# Patient Record
Sex: Female | Born: 1970 | Race: Black or African American | Hispanic: No | Marital: Single | Smoking: Current every day smoker
Health system: Southern US, Community
[De-identification: ages and names within clinical notes are randomized; demographics above are authoritative.]

## PROBLEM LIST (undated history)

## (undated) DIAGNOSIS — IMO0002 Reserved for concepts with insufficient information to code with codable children: Secondary | ICD-10-CM

## (undated) DIAGNOSIS — D259 Leiomyoma of uterus, unspecified: Secondary | ICD-10-CM

## (undated) DIAGNOSIS — G8929 Other chronic pain: Secondary | ICD-10-CM

## (undated) DIAGNOSIS — K2980 Duodenitis without bleeding: Secondary | ICD-10-CM

## (undated) DIAGNOSIS — R109 Unspecified abdominal pain: Secondary | ICD-10-CM

## (undated) DIAGNOSIS — K760 Fatty (change of) liver, not elsewhere classified: Secondary | ICD-10-CM

## (undated) DIAGNOSIS — M549 Dorsalgia, unspecified: Secondary | ICD-10-CM

## (undated) DIAGNOSIS — E785 Hyperlipidemia, unspecified: Secondary | ICD-10-CM

## (undated) DIAGNOSIS — F32A Depression, unspecified: Secondary | ICD-10-CM

## (undated) DIAGNOSIS — F419 Anxiety disorder, unspecified: Secondary | ICD-10-CM

## (undated) DIAGNOSIS — F1210 Cannabis abuse, uncomplicated: Secondary | ICD-10-CM

## (undated) DIAGNOSIS — F329 Major depressive disorder, single episode, unspecified: Secondary | ICD-10-CM

## (undated) DIAGNOSIS — C8590 Non-Hodgkin lymphoma, unspecified, unspecified site: Secondary | ICD-10-CM

## (undated) HISTORY — DX: Non-Hodgkin lymphoma, unspecified, unspecified site: C85.90

## (undated) HISTORY — DX: Other chronic pain: G89.29

## (undated) HISTORY — DX: Fatty (change of) liver, not elsewhere classified: K76.0

## (undated) HISTORY — DX: Dorsalgia, unspecified: M54.9

## (undated) HISTORY — DX: Unspecified abdominal pain: R10.9

## (undated) HISTORY — DX: Reserved for concepts with insufficient information to code with codable children: IMO0002

## (undated) HISTORY — DX: Duodenitis without bleeding: K29.80

## (undated) HISTORY — DX: Cannabis abuse, uncomplicated: F12.10

## (undated) HISTORY — DX: Leiomyoma of uterus, unspecified: D25.9

## (undated) HISTORY — DX: Hyperlipidemia, unspecified: E78.5

---

## 2005-11-25 ENCOUNTER — Emergency Department (HOSPITAL_COMMUNITY): Admission: EM | Admit: 2005-11-25 | Discharge: 2005-11-25 | Payer: Self-pay | Admitting: Emergency Medicine

## 2006-09-06 ENCOUNTER — Ambulatory Visit: Payer: Self-pay

## 2006-11-28 ENCOUNTER — Emergency Department (HOSPITAL_COMMUNITY): Admission: EM | Admit: 2006-11-28 | Discharge: 2006-11-28 | Payer: Self-pay | Admitting: Emergency Medicine

## 2006-12-06 HISTORY — PX: ABDOMINAL HYSTERECTOMY: SHX81

## 2007-10-18 ENCOUNTER — Encounter: Payer: Self-pay | Admitting: Obstetrics & Gynecology

## 2007-10-18 ENCOUNTER — Inpatient Hospital Stay (HOSPITAL_COMMUNITY): Admission: RE | Admit: 2007-10-18 | Discharge: 2007-10-20 | Payer: Self-pay | Admitting: Obstetrics & Gynecology

## 2008-01-16 ENCOUNTER — Ambulatory Visit (HOSPITAL_COMMUNITY): Admission: RE | Admit: 2008-01-16 | Discharge: 2008-01-16 | Payer: Self-pay | Admitting: Family Medicine

## 2008-01-29 ENCOUNTER — Emergency Department (HOSPITAL_COMMUNITY): Admission: EM | Admit: 2008-01-29 | Discharge: 2008-01-29 | Payer: Self-pay | Admitting: Emergency Medicine

## 2008-02-19 ENCOUNTER — Ambulatory Visit (HOSPITAL_COMMUNITY): Admission: RE | Admit: 2008-02-19 | Discharge: 2008-02-19 | Payer: Self-pay | Admitting: Obstetrics & Gynecology

## 2008-03-05 ENCOUNTER — Encounter (HOSPITAL_COMMUNITY)
Admission: RE | Admit: 2008-03-05 | Discharge: 2008-04-04 | Payer: Self-pay | Admitting: Rehabilitative and Restorative Service Providers"

## 2008-04-04 ENCOUNTER — Ambulatory Visit (HOSPITAL_COMMUNITY): Admission: RE | Admit: 2008-04-04 | Discharge: 2008-04-04 | Payer: Self-pay | Admitting: Family Medicine

## 2008-04-05 HISTORY — PX: ESOPHAGOGASTRODUODENOSCOPY: SHX1529

## 2008-04-14 ENCOUNTER — Emergency Department (HOSPITAL_COMMUNITY): Admission: EM | Admit: 2008-04-14 | Discharge: 2008-04-14 | Payer: Self-pay | Admitting: Emergency Medicine

## 2008-04-26 ENCOUNTER — Ambulatory Visit: Payer: Self-pay | Admitting: Internal Medicine

## 2008-05-01 ENCOUNTER — Ambulatory Visit: Payer: Self-pay | Admitting: Internal Medicine

## 2008-05-01 ENCOUNTER — Ambulatory Visit (HOSPITAL_COMMUNITY): Admission: RE | Admit: 2008-05-01 | Discharge: 2008-05-01 | Payer: Self-pay | Admitting: Internal Medicine

## 2008-05-01 ENCOUNTER — Encounter: Payer: Self-pay | Admitting: Internal Medicine

## 2008-05-06 HISTORY — PX: EUS: SHX5427

## 2008-05-07 DIAGNOSIS — IMO0002 Reserved for concepts with insufficient information to code with codable children: Secondary | ICD-10-CM | POA: Insufficient documentation

## 2008-05-10 ENCOUNTER — Encounter: Payer: Self-pay | Admitting: Gastroenterology

## 2008-05-14 ENCOUNTER — Encounter: Payer: Self-pay | Admitting: Gastroenterology

## 2008-05-14 DIAGNOSIS — K5710 Diverticulosis of small intestine without perforation or abscess without bleeding: Secondary | ICD-10-CM | POA: Insufficient documentation

## 2008-05-23 ENCOUNTER — Encounter: Payer: Self-pay | Admitting: Gastroenterology

## 2008-05-23 ENCOUNTER — Ambulatory Visit (HOSPITAL_COMMUNITY): Admission: RE | Admit: 2008-05-23 | Discharge: 2008-05-23 | Payer: Self-pay | Admitting: Gastroenterology

## 2008-05-27 ENCOUNTER — Ambulatory Visit: Payer: Self-pay | Admitting: Gastroenterology

## 2008-06-03 ENCOUNTER — Ambulatory Visit: Payer: Self-pay | Admitting: Gastroenterology

## 2008-06-10 ENCOUNTER — Encounter (HOSPITAL_COMMUNITY): Admission: RE | Admit: 2008-06-10 | Discharge: 2008-07-10 | Payer: Self-pay | Admitting: Gastroenterology

## 2008-06-25 ENCOUNTER — Ambulatory Visit: Payer: Self-pay | Admitting: Internal Medicine

## 2008-06-27 ENCOUNTER — Ambulatory Visit (HOSPITAL_COMMUNITY): Admission: RE | Admit: 2008-06-27 | Discharge: 2008-06-27 | Payer: Self-pay | Admitting: Internal Medicine

## 2008-09-05 HISTORY — PX: ESOPHAGOGASTRODUODENOSCOPY: SHX1529

## 2008-09-12 ENCOUNTER — Emergency Department (HOSPITAL_COMMUNITY): Admission: EM | Admit: 2008-09-12 | Discharge: 2008-09-12 | Payer: Self-pay | Admitting: Emergency Medicine

## 2008-09-17 ENCOUNTER — Ambulatory Visit: Payer: Self-pay | Admitting: Internal Medicine

## 2008-09-30 ENCOUNTER — Encounter (HOSPITAL_COMMUNITY): Admission: RE | Admit: 2008-09-30 | Discharge: 2008-10-30 | Payer: Self-pay | Admitting: Internal Medicine

## 2008-12-19 ENCOUNTER — Emergency Department: Payer: Self-pay | Admitting: Emergency Medicine

## 2009-02-19 ENCOUNTER — Emergency Department (HOSPITAL_COMMUNITY): Admission: EM | Admit: 2009-02-19 | Discharge: 2009-02-19 | Payer: Self-pay | Admitting: Emergency Medicine

## 2009-05-23 ENCOUNTER — Emergency Department (HOSPITAL_COMMUNITY): Admission: EM | Admit: 2009-05-23 | Discharge: 2009-05-23 | Payer: Self-pay | Admitting: Emergency Medicine

## 2009-06-05 ENCOUNTER — Encounter (INDEPENDENT_AMBULATORY_CARE_PROVIDER_SITE_OTHER): Payer: Self-pay | Admitting: Diagnostic Radiology

## 2009-06-05 ENCOUNTER — Ambulatory Visit (HOSPITAL_COMMUNITY): Admission: RE | Admit: 2009-06-05 | Discharge: 2009-06-05 | Payer: Self-pay | Admitting: Family Medicine

## 2009-11-28 IMAGING — CT CT ABDOMEN W/ CM
2 of 3 series · 14 of 32 positions shown, 20 images · IV contrast (Omnipaque 300)
Comparison: None

CLINICAL DATA: Right upper quadrant pain enlarged liver

CT ABDOMEN WITH CONTRAST
TECHNIQUE: Multidetector CT imaging of the abdomen was performed
following the standard protocol during bolus administration of
intravenous contrast.
Contrast: 100 ml Dmnipaque-PPP

[Series 2: abd_pel 5.0 b40f · axial · 0.69mm/px · z∈[-310,-70]mm · 12 of 58 slices shown, 18 images]
[im 5/58  soft-tissue]
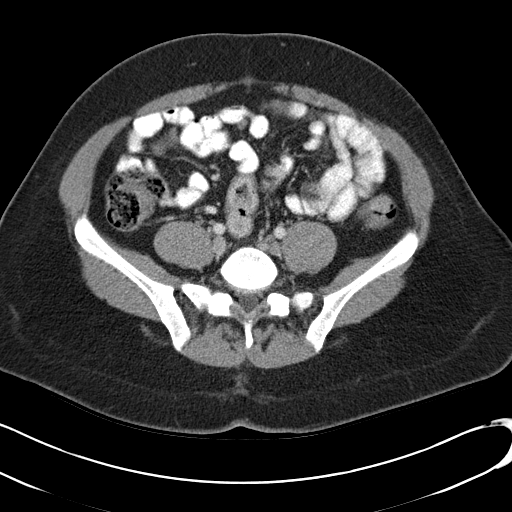
[im 5/58  bone]
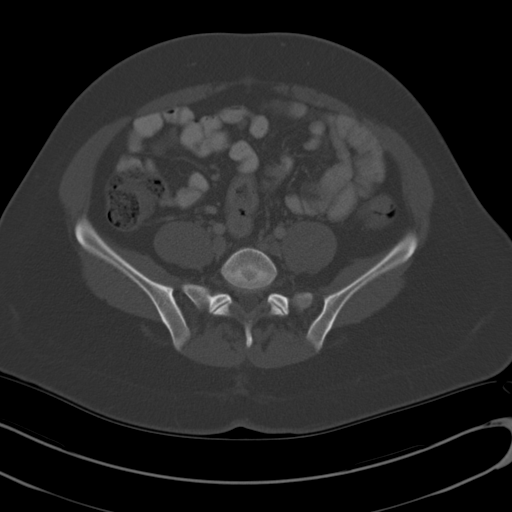
[im 9/58  soft-tissue]
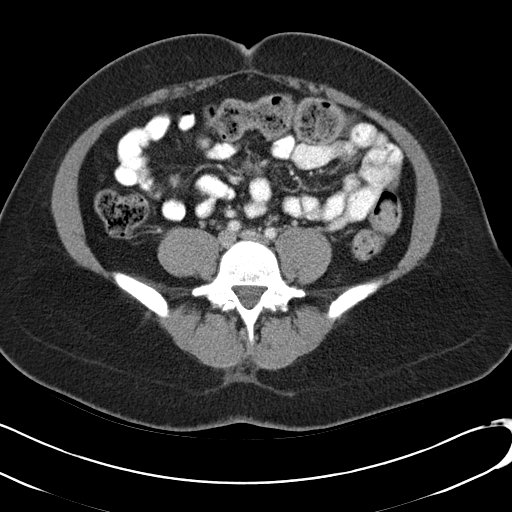
[im 14/58  soft-tissue]
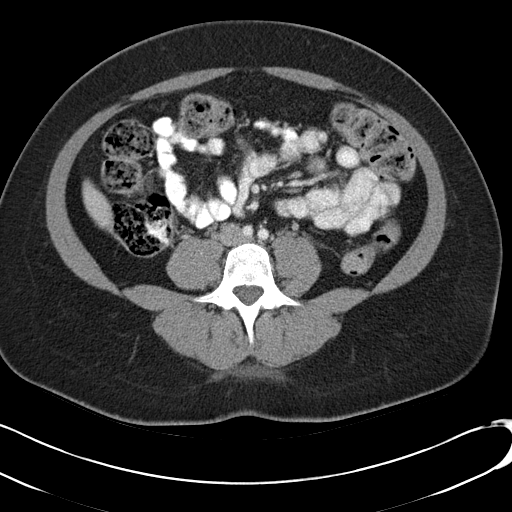
[im 18/58  soft-tissue]
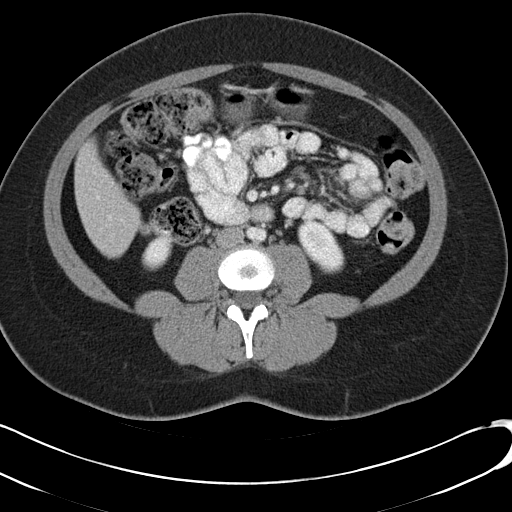
[im 22/58  soft-tissue]
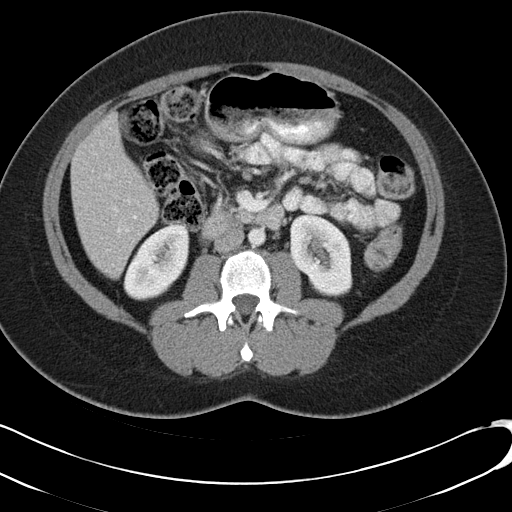
[im 27/58  soft-tissue]
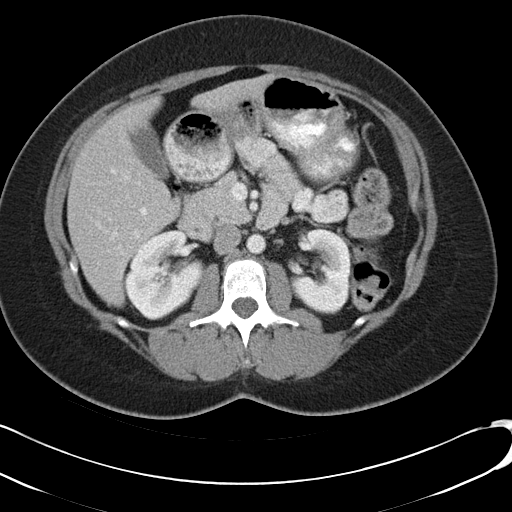
[im 31/58  soft-tissue]
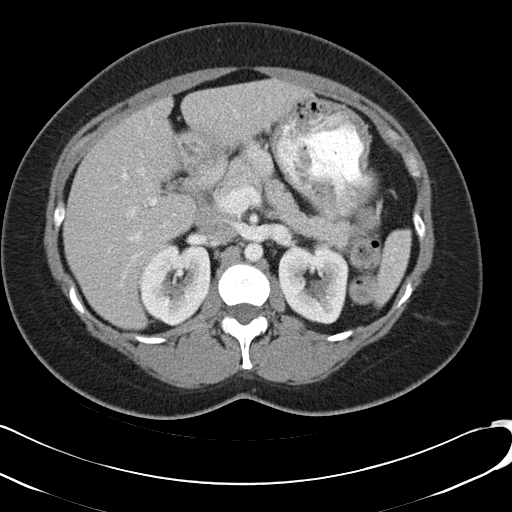
[im 36/58  soft-tissue]
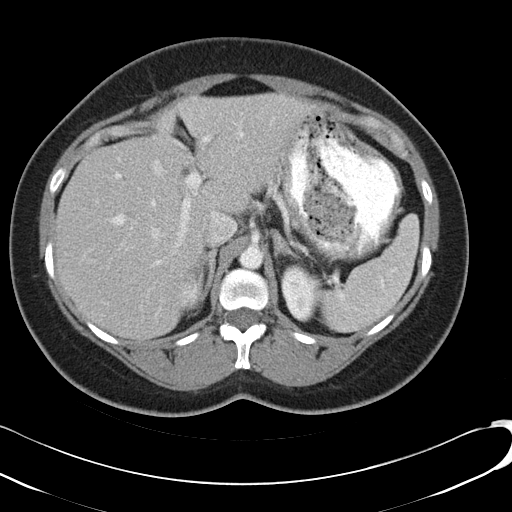
[im 40/58  soft-tissue]
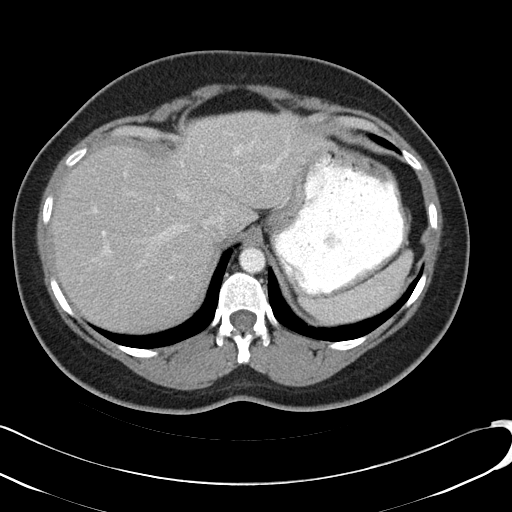
[im 40/58  lung]
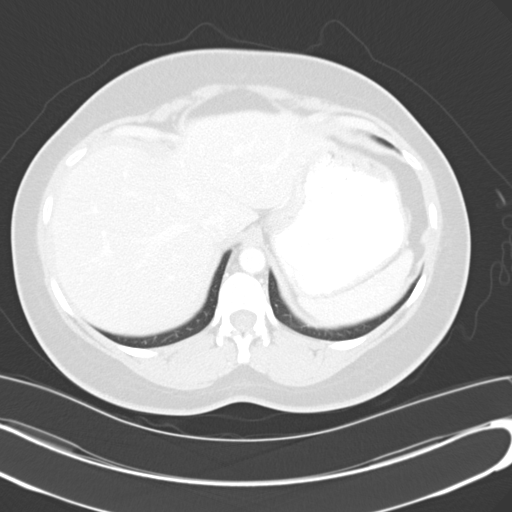
[im 40/58  bone]
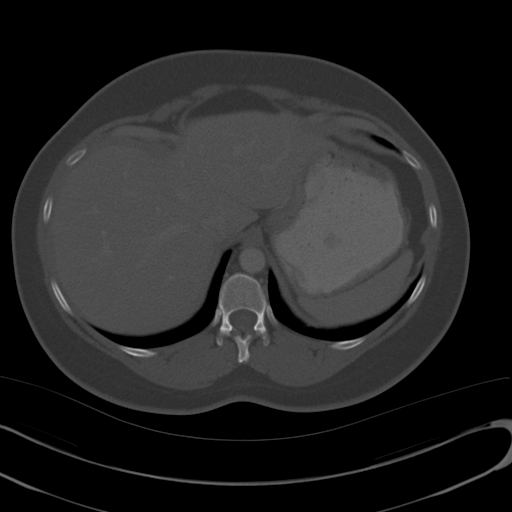
[im 44/58  soft-tissue]
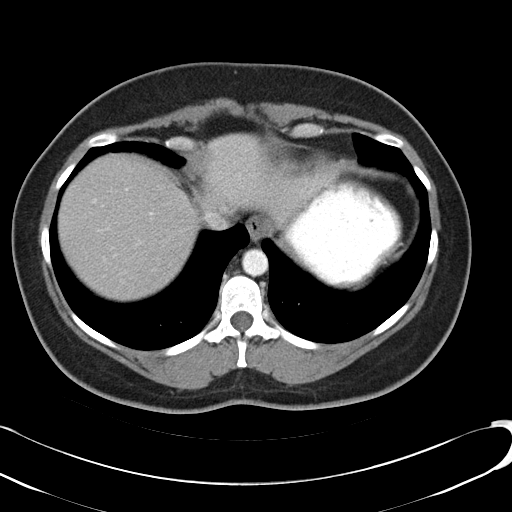
[im 44/58  lung]
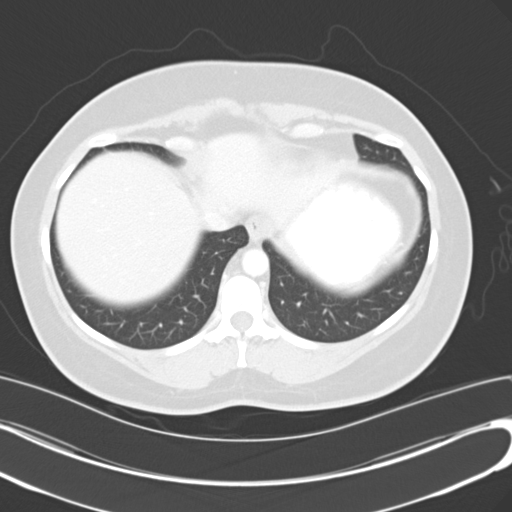
[im 49/58  soft-tissue]
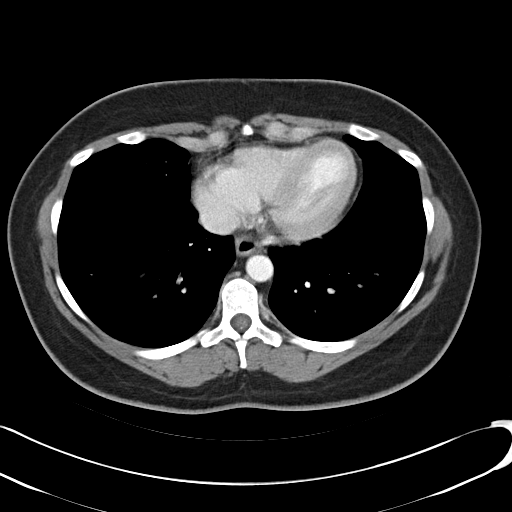
[im 49/58  lung]
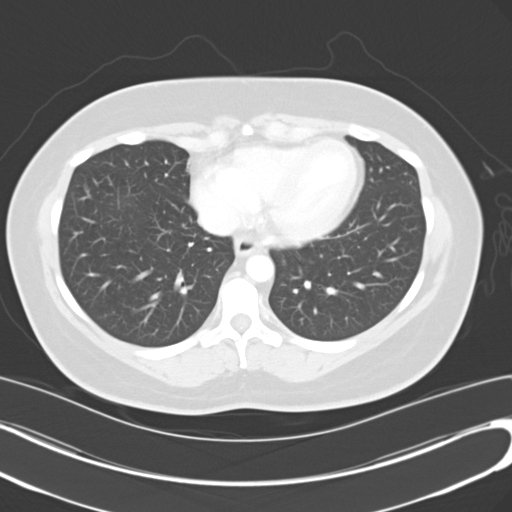
[im 53/58  soft-tissue]
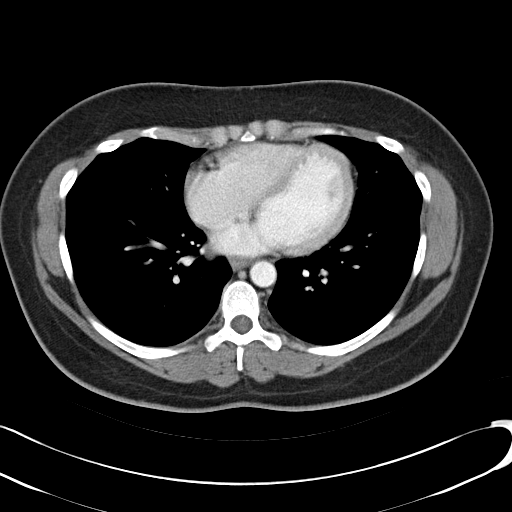
[im 53/58  lung]
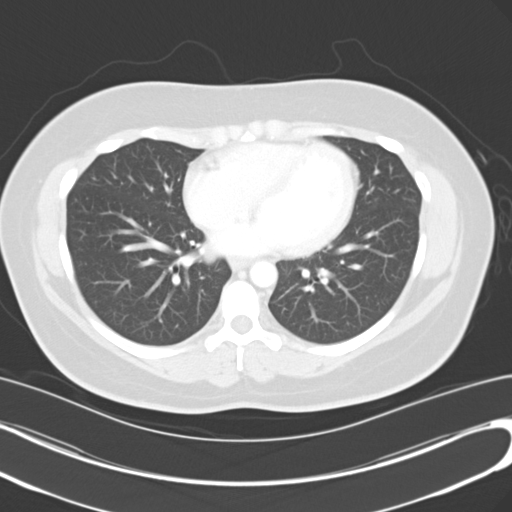

[Series 3: lung 5.0 b70f · axial · 0.69mm/px · z∈[-130,-110]mm · 2 of 22 slices shown]
[im 5/22  bone]
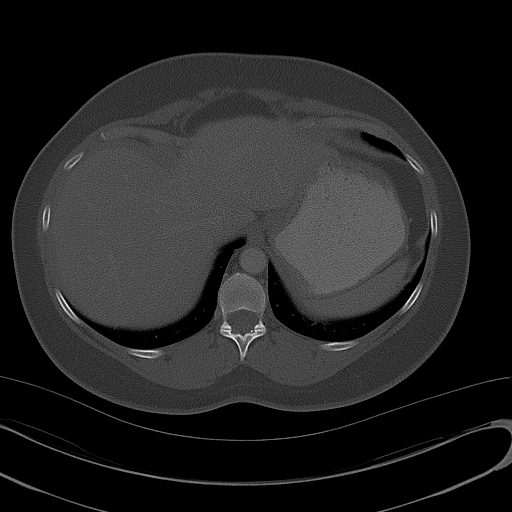
[im 9/22  bone]
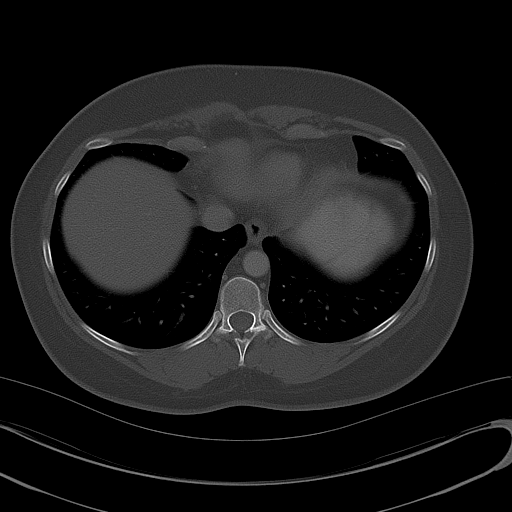

[14 of 32 positions shown; findings below may reference images not displayed]

FINDINGS: Lung bases are clear.  No pleural or pericardial fluid.
The liver appears within normal limits without evidence of focal
lesion or biliary ductal dilatation.  Gallbladder appears
unremarkable.  Size of the liver appears normal.  The spleen,
pancreas, adrenal glands and kidneys are normal.  The aorta and IVC
are normal.  No retroperitoneal mass or adenopathy.  No free
intraperitoneal fluid or air.  No bowel pathology seen in the
abdominal portion of the scan.
IMPRESSION: Examination within normal limits.

## 2009-12-06 DIAGNOSIS — C8590 Non-Hodgkin lymphoma, unspecified, unspecified site: Secondary | ICD-10-CM

## 2009-12-06 HISTORY — DX: Non-Hodgkin lymphoma, unspecified, unspecified site: C85.90

## 2009-12-06 HISTORY — PX: LYMPHADENECTOMY: SHX15

## 2010-07-23 ENCOUNTER — Encounter (INDEPENDENT_AMBULATORY_CARE_PROVIDER_SITE_OTHER): Payer: Self-pay

## 2010-07-28 ENCOUNTER — Ambulatory Visit: Payer: Self-pay | Admitting: Internal Medicine

## 2010-07-28 ENCOUNTER — Encounter: Payer: Self-pay | Admitting: Gastroenterology

## 2010-07-28 DIAGNOSIS — R1013 Epigastric pain: Secondary | ICD-10-CM

## 2010-07-28 DIAGNOSIS — D721 Eosinophilia: Secondary | ICD-10-CM

## 2010-07-28 DIAGNOSIS — C8589 Other specified types of non-Hodgkin lymphoma, extranodal and solid organ sites: Secondary | ICD-10-CM

## 2010-07-29 DIAGNOSIS — D696 Thrombocytopenia, unspecified: Secondary | ICD-10-CM | POA: Insufficient documentation

## 2010-07-30 ENCOUNTER — Ambulatory Visit: Payer: Self-pay | Admitting: Internal Medicine

## 2010-07-30 ENCOUNTER — Ambulatory Visit (HOSPITAL_COMMUNITY): Admission: RE | Admit: 2010-07-30 | Discharge: 2010-07-30 | Payer: Self-pay | Admitting: Internal Medicine

## 2010-07-31 ENCOUNTER — Encounter: Payer: Self-pay | Admitting: Gastroenterology

## 2010-07-31 HISTORY — PX: COLONOSCOPY: SHX174

## 2010-07-31 LAB — CONVERTED CEMR LAB
Basophils Relative: 0 % (ref 0–1)
Eosinophils Absolute: 0.2 10*3/uL (ref 0.0–0.7)
MCHC: 33.2 g/dL (ref 30.0–36.0)
MCV: 93.9 fL (ref 78.0–100.0)
Monocytes Relative: 8 % (ref 3–12)
Neutrophils Relative %: 54 % (ref 43–77)
Platelets: 135 10*3/uL — ABNORMAL LOW (ref 150–400)
RBC: 4.56 M/uL (ref 3.87–5.11)

## 2010-08-04 ENCOUNTER — Encounter: Payer: Self-pay | Admitting: Internal Medicine

## 2010-08-06 ENCOUNTER — Encounter (INDEPENDENT_AMBULATORY_CARE_PROVIDER_SITE_OTHER): Payer: Self-pay

## 2010-08-06 ENCOUNTER — Telehealth (INDEPENDENT_AMBULATORY_CARE_PROVIDER_SITE_OTHER): Payer: Self-pay

## 2010-08-18 ENCOUNTER — Encounter: Payer: Self-pay | Admitting: Urgent Care

## 2010-08-18 ENCOUNTER — Ambulatory Visit: Payer: Self-pay | Admitting: Internal Medicine

## 2010-08-18 DIAGNOSIS — R112 Nausea with vomiting, unspecified: Secondary | ICD-10-CM

## 2010-08-18 DIAGNOSIS — R109 Unspecified abdominal pain: Secondary | ICD-10-CM | POA: Insufficient documentation

## 2010-08-18 DIAGNOSIS — R110 Nausea: Secondary | ICD-10-CM | POA: Insufficient documentation

## 2010-08-19 ENCOUNTER — Ambulatory Visit: Payer: Self-pay | Admitting: Internal Medicine

## 2010-08-19 ENCOUNTER — Ambulatory Visit (HOSPITAL_COMMUNITY): Admission: RE | Admit: 2010-08-19 | Discharge: 2010-08-19 | Payer: Self-pay | Admitting: Internal Medicine

## 2010-08-19 HISTORY — PX: ESOPHAGOGASTRODUODENOSCOPY: SHX1529

## 2010-08-21 LAB — CONVERTED CEMR LAB
Basophils Absolute: 0 10*3/uL (ref 0.0–0.1)
Basophils Relative: 0 % (ref 0–1)
MCHC: 33 g/dL (ref 30.0–36.0)
Monocytes Relative: 6 % (ref 3–12)
Neutro Abs: 3.1 10*3/uL (ref 1.7–7.7)
Neutrophils Relative %: 65 % (ref 43–77)
RBC: 4.65 M/uL (ref 3.87–5.11)

## 2010-08-24 ENCOUNTER — Encounter: Payer: Self-pay | Admitting: Internal Medicine

## 2010-08-27 ENCOUNTER — Encounter: Payer: Self-pay | Admitting: Internal Medicine

## 2010-09-08 ENCOUNTER — Telehealth (INDEPENDENT_AMBULATORY_CARE_PROVIDER_SITE_OTHER): Payer: Self-pay

## 2010-09-15 ENCOUNTER — Ambulatory Visit (HOSPITAL_COMMUNITY): Admission: RE | Admit: 2010-09-15 | Discharge: 2010-09-15 | Payer: Self-pay | Admitting: Family Medicine

## 2010-09-28 ENCOUNTER — Encounter (INDEPENDENT_AMBULATORY_CARE_PROVIDER_SITE_OTHER): Payer: Self-pay | Admitting: *Deleted

## 2010-09-29 ENCOUNTER — Ambulatory Visit: Payer: Self-pay | Admitting: Internal Medicine

## 2010-09-30 ENCOUNTER — Encounter: Payer: Self-pay | Admitting: Internal Medicine

## 2010-10-15 ENCOUNTER — Encounter: Payer: Self-pay | Admitting: Internal Medicine

## 2010-10-28 ENCOUNTER — Encounter: Payer: Self-pay | Admitting: Internal Medicine

## 2010-11-05 ENCOUNTER — Ambulatory Visit: Payer: Self-pay | Admitting: Internal Medicine

## 2010-11-09 DIAGNOSIS — K5900 Constipation, unspecified: Secondary | ICD-10-CM | POA: Insufficient documentation

## 2010-11-17 ENCOUNTER — Telehealth (INDEPENDENT_AMBULATORY_CARE_PROVIDER_SITE_OTHER): Payer: Self-pay | Admitting: *Deleted

## 2010-11-18 ENCOUNTER — Telehealth (INDEPENDENT_AMBULATORY_CARE_PROVIDER_SITE_OTHER): Payer: Self-pay

## 2010-12-01 ENCOUNTER — Encounter: Payer: Self-pay | Admitting: Internal Medicine

## 2010-12-06 HISTORY — PX: BREAST EXCISIONAL BIOPSY: SUR124

## 2010-12-10 ENCOUNTER — Ambulatory Visit (HOSPITAL_COMMUNITY)
Admission: RE | Admit: 2010-12-10 | Discharge: 2010-12-10 | Payer: Self-pay | Source: Home / Self Care | Attending: Neurology | Admitting: Neurology

## 2010-12-14 ENCOUNTER — Encounter: Payer: Self-pay | Admitting: Internal Medicine

## 2010-12-27 ENCOUNTER — Encounter: Payer: Self-pay | Admitting: Family Medicine

## 2011-01-06 NOTE — Letter (Signed)
Summary: Patient Notice, Endo Biopsy Results  [facility]  [address]   [place], [place] [address]   Phone: [phone]  Fax: [phone]       August 24, 2010   Donna Ramirez [address] [place], [place]  [address] 05-Aug-1971    Dear [name],  I am pleased to inform you that the biopsies taken during your recent endoscopic examination did not show any evidence of cancer upon pathologic examination.  There was mild inflammation.  Please call us if you are having persistent problems or have questions about your condition that have not been fully answered at this time.  Sincerely,    R. Roetta Sessions MD, FACP [facility] Ph: [phone]   Fax: [phone]   Appended Document: Patient Notice, Endo Biopsy Results Letter mailed to pt.   Appended Document: Patient Notice, Endo Biopsy Results reminder in computer

## 2011-01-06 NOTE — Medication Information (Signed)
Summary: vicodin rx  vicodin rx   Imported By: Hendricks Limes LPN 60/45/4098 11:91:47  _____________________________________________________________________  External Attachment:    Type:   Image     Comment:   External Document

## 2011-01-06 NOTE — Assessment & Plan Note (Signed)
Summary: CONSULT FOR TCS/ABD PAIN/SS   Visit Type:  Consult Referring Provider:  Hoover Brunette Primary Care Provider:  [facility]  Chief Complaint:  abd pain.  History of Present Illness: Donna Ramirez is a pleasant 40 y/o AA female, patient of Dr. Lahoma Rocker, who presents for further evaluation of chronic abd pain. Last seen in 10/09. She has h/o chronic abd wall pain. She was evaluated here locally as well as [facility]. She was set up for PT for chronic abd wall pain. She went to at least one visit, but had multiple no shows thereafter. Patient also had EGD by Dr. Jena Gauss in 2009 which showed mild RE, 1.5cm peduncualted mass of distal D2. Bx benign. EUS by Dr. Christella Hartigan showed focal mild inflammation assoicated with slight atypia of ampullary mucosa, benign small duodenal nodule. EGD by Dr. Margaretha Glassing 10/09 showed no duodenal mass and was normal exam.  Since her last visit, she was diagnosed with lymphoma (8/10). She is followed at [facility]. Treatments with chemo/xrt. Finished chemo 12/10. Finished xrt spring 2011.   She continues to have chronic abd pain. Nothing makes better/worse. If walks, hurts. Epigastrium. Occasionally radiates into back. Unrelated to BMs. Sometimes if upset/nervous makes worse.  No heartburn/indigestion. No dysphagia. BM regular. No melena, brbpr. No weight loss.   Labs 06/01/10: Tbili 0.5, AP 90, AST 15, ALT 13, lipase 19. OLD LABS 7/10: Plt 117,000, EOS 14%H, HIV neg, ANA neg,   Current Medications (verified): 1)  Estratest .... Once Daily  Allergies: No Known Drug Allergies  Past History:  Past Medical History: Current Problems:  DEGENERATIVE DISC DISEASE (ICD-722.6) chronic abd pain, seen at [facility], 2009-->abd wall pain EGD 5/09, Dr. Dellie Catholic RE, noncritical Schatzki's ring s/p dilatation, 1.5cm pedunculated mass distal D2 (bx negative) EUS, 6/09, Dr. Lauralyn Primes pedunculated lesion seen. 6-81mm nodule seen and had evidence of recent  mucosal bx (2-3cm distal to major papilla). Bx benign. 1cm somewhat adenomatous mucos at major papilla bx(focal mild inflammation associated with sligh atypia). EGD at [facility] 10/09 was normal  degenerative disc disease hearing loss in childhood,cause unknown ascus on pap 03 with multi normal paps since non-hodgkins lymphoma marijunana abuse back pain chronic nausea mild fatty infiltration of the liver uterine fibroids, s/p hysterectomy  Past Surgical History: BSO 11/08 Hysterectomy 11/08 Lymph node bx, lymphoma  Family History: Family History of Breast Cancer: Mother age 2 Family History of Colon Cancer: questionable PGM, questionable great Uncle/Aunt Family History of Prostate Cancer: Father Family History of Diabetes: MGM, Aunt Aunt, ischemic bowel Father, colon polyps  Social History: Disabled. 1/2ppd, trying to cut-back. Rare alcohol. Some marijuana use, once per week. No IV/intranasal drugs. No children. In relationship, with girlfriend.  Review of Systems General:  Denies fever, chills, sweats, anorexia, fatigue, weakness, and weight loss; weight down 20 pounds since 2009. Patient denies recent weight loss.. Eyes:  Denies vision loss. ENT:  Denies nasal congestion, sore throat, hoarseness, and difficulty swallowing. CV:  Denies chest pains, angina, palpitations, dyspnea on exertion, and peripheral edema. Resp:  Denies dyspnea at rest, dyspnea with exercise, cough, sputum, and wheezing. GI:  See HPI. GU:  Denies urinary burning and blood in urine. MS:  Complains of low back pain; denies joint pain / LOM. Derm:  Denies rash and itching. Neuro:  Denies weakness, frequent headaches, memory loss, and confusion. Psych:  Denies depression and anxiety. Endo:  Denies unusual weight change. Heme:  Denies bruising and bleeding. Allergy:  Denies hives and rash.  Vital Signs:  Patient profile:  40 year old female Height:      65 inches Weight:      157 pounds BMI:      26.22 Temp:     97.9 degrees F oral Pulse rate:   68 / minute BP sitting:   112 / 78  (left arm) Cuff size:   regular  Vitals Entered By: Hendricks Limes LPN (July 28, 2010 8:40 AM)  Physical Exam  General:  Well developed, well nourished, no acute distress. Head:  Normocephalic and atraumatic. Eyes:  sclera nonicteric Mouth:  Oropharyngeal mucosa moist, pink.  No lesions, erythema or exudate.    Neck:  Supple; no masses or thyromegaly. Lungs:  Clear throughout to auscultation. Heart:  Regular rate and rhythm; no murmurs, rubs,  or bruits. Abdomen:  Soft. Positive BS. Mild to moderate epigastric tenderness. No rebound or guarding. No HSM or masses. No abd bruit or hernia.  Rectal:  deferred until time of colonoscopy.   Extremities:  No clubbing, cyanosis, edema or deformities noted. Neurologic:  Alert and  oriented x4;  grossly normal neurologically. Skin:  Intact without significant lesions or rashes. Cervical Nodes:  No significant cervical adenopathy. Psych:  Alert and cooperative. Normal mood and affect.  Impression & Recommendations:  Problem # 1:  ABDOMINAL PAIN, EPIGASTRIC (ICD-789.06) Chronic abd pain with h/o chronic abd wall pain. She did not follow-through with PT in 2009. Since has had diagnosis of lymphoma, has completed treatment at this point. Need to obtain records from [facility] (Dr. Hoyle Sauer). Will see if any recent imaging done. Interestingly, older CBC showed elevated eosinophil count. Recheck CBC. If remains elevated, she will need EGD with bx. Will discuss with Dr. Jena Gauss as well.  Orders: T-CBC w/Diff ([id number]) Consultation Level IV (72536)  Problem # 2:  NEOPLASM, MALIGNANT, COLON, FAMILY HX (ICD-V16.0)  FH of CRC grandparent, father has had colon polyps, personal h/o lymphoma. Patient with abd pain. PCP requesting TCS. Colonoscopy to be performed in near future.  Risks, alternatives, and benefits including but not limited to the risk of reaction to  medication, bleeding, infection, and perforation were addressed.  Patient voiced understanding and provided verbal consent.   Orders: Consultation Level IV (64403) I would like to thank Dr. Lahoma Rocker for allowing [place] to take part in the care of this nice patient.  Appended Document: CONSULT FOR TCS/ABD PAIN/SS Discussed with Dr. Jena Gauss. Given h/o epigstric pain, lymphoma, duodenal lesion,etc would go ahead and do EGD at time of TCS.  Please let pt know. Please note RX for pain as requested by patient yesterday but she left office first. Please call in to pharmacy of choice.  Please add EGD at time of her TCS.     Prescriptions: HYDROCODONE-ACETAMINOPHEN 5-500 MG TABS (HYDROCODONE-ACETAMINOPHEN) one by mouth every 4-6 hours as needed pain  #20 x 0   Entered and Authorized by:   Leanna Battles. Dixon Boos   Signed by:   Leanna Battles Lewis PA-C on 07/29/2010   Method used:   Telephoned to ...         RxID:   [id number]     Appended Document: CONSULT FOR TCS/ABD PAIN/SS Called, mail box full.  Appended Document: CONSULT FOR TCS/ABD PAIN/SS pt had tcs on 07/30/10  Appended Document: CONSULT FOR TCS/ABD PAIN/SS Discussed with Dr. Jena Gauss. Hold on EGD for now. Patient needs OV for f/u in 4 weeks. Will reevaluate then, if still with abd pain, consider EGD at that point.

## 2011-01-06 NOTE — Letter (Signed)
Summary: CLINIC NOTE FROM [facility]  CLINIC NOTE FROM [facility]   Imported By: Rexene Alberts 10/28/2010 16:07:16  _____________________________________________________________________  External Attachment:    Type:   Image     Comment:   External Document

## 2011-01-06 NOTE — Letter (Signed)
Summary: TCS ORDER  TCS ORDER   Imported By: Rexene Alberts 07/28/2010 10:02:30  _____________________________________________________________________  External Attachment:    Type:   Image     Comment:   External Document

## 2011-01-06 NOTE — Progress Notes (Signed)
Summary: pt requesting pain meds  Phone Note Call from Patient Call back at Home Phone [phone] Call back at [phone]   Caller: Patient Summary of Call: pt called- left voicemail- she has appt on Oct. 25th with [facility]. wants to know if she can have some non-narcotic pain meds to last untill appt. Vicodin makes her sick on her stomach. pt requestin ultram or something similar called to [employer]. Initial call taken by: Hendricks Limes LPN,  September 08, 2010 9:31 AM     Appended Document: pt requesting pain meds    Prescriptions: LEVSIN 0.125 MG TABS (HYOSCYAMINE SULFATE) 1 by mouth ac/qhs as needed (up to QID) for abd pain  #90 x 1   Entered and Authorized by:   Joselyn Arrow FNP-BC   Signed by:   Joselyn Arrow FNP-BC on 09/08/2010   Method used:   Electronically to        [facility] (retail)       [address]       [place], [place]  [address]       Ph: [phone]       Fax: [phone]   RxID:   [id number]

## 2011-01-06 NOTE — Letter (Signed)
Summary: Scheduled Appointment  [facility]  [address]   [place], [place] [address]   Phone: [phone]  Fax: [phone]    September 28, 2010   Dear: Layla Barter            DOB: [birth year]    I have been instructed to schedule you an appointment in our office.  Your appointment is as follows:   Date:              September 29, 2010   Time:              11AM     Please be here 15 minutes early.   Provider:        Jaye Beagle   Please contact the office if you need to reschedule this appointment for a more convenient time.   Thank you,    Diana Eves       [facility] Ph: [phone]   Fax: [phone]

## 2011-01-06 NOTE — Assessment & Plan Note (Signed)
Summary: REEVAL TO SEE IF EGD IS NEEDED/LAW   Visit Type:  Follow-up Visit Primary Care Donna Ramirez:  [facility])  Chief Complaint:  ? EGD.  History of Present Illness: 40 y/o black female w/ chronic abd pain.  Pain used to be intermittant, but now all day , every day.  Upper abd pain, feels like cramps, 10/10, takes vicodin 5/500mg  prn once daily or BID usually.    c/o nausea, no vomiting,  Wt stable,  Appetite ok.  BM Q1-2 days.  Denies dysphagia, odynophagia, indigestion, or heartburn.  Denies fever, occ night sweats.  Dr Barbara Cower [facility]-oncologist.  Pt in remission Diffuse B-cell lymphoma.   Recent colonscopy by Dr Rourk->hyperplastic polyp.  H/o chronic abd wall pain. She was evaluated here locally as well as [facility]. Had EGD by Dr. Jena Gauss in 2009 which showed mild RE, 1.5cm peduncualted mass of distal D2. Bx benign. EUS by Dr. Christella Hartigan showed focal mild inflammation assoicated with slight atypia of ampullary mucosa, benign small duodenal nodule. EGD by Dr. Margaretha Glassing 10/09 showed no duodenal mass and was normal exam. She is here to set up EGD for further evaluation of her chronic abd pain, unchanged w/ PPI use.   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 Once Daily 2)  Hydrocodone-Acetaminophen 5-500 Mg Tabs (Hydrocodone-Acetaminophen) .... One By Mouth Every 4-6 Hours As Needed Pain  Allergies (verified): No Known Drug Allergies  Past History:  Family History: Last updated: 07/28/2010 Family History of Breast Cancer: Mother age 65 Family History of Colon Cancer: questionable PGM, questionable great  Uncle/Aunt Family History of Prostate Cancer: Father Family History of Diabetes: MGM, Aunt Aunt, ischemic bowel Father, colon polyps  Social History: Last updated: 07/28/2010 Disabled. 1/2ppd, trying to cut-back. Rare alcohol. Some marijuana use, once per week. No IV/intranasal drugs. No children. In relationship, with girlfriend.  Past Medical History: Current Problems: colonoscopy Dr Jena Gauss 8/26->hyperplastic polyp DEGENERATIVE DISC DISEASE (ICD-722.6) chronic abd pain, seen at [facility], 2009-->abd wall pain EGD 5/09, Dr. Dellie Catholic RE, noncritical Schatzki's ring s/p dilatation, 1.5cm pedunculated mass distal D2 (bx negative) EUS, 6/09, Dr. Lauralyn Primes pedunculated lesion seen. 6-22mm nodule seen and had evidence of recent mucosal bx (2-3cm distal to major papilla). Bx benign. 1cm somewhat adenomatous mucos at major papilla bx(focal mild inflammation associated with sligh atypia). EGD at [facility] 10/09 was normal  degenerative disc disease hearing loss in childhood,cause unknown ascus on pap 03 with multi normal paps since non-hodgkins lymphoma marijunana abuse back pain chronic nausea mild fatty infiltration of the liver uterine fibroids, s/p hysterectomy  Past Surgical History: Reviewed history from 07/28/2010 and no changes required. BSO 11/08 Hysterectomy 11/08 Lymph node bx, lymphoma  Review of Systems      See HPI General:  Denies fever, chills, sweats, anorexia, fatigue, weakness, malaise, weight loss, and sleep disorder. CV:  Denies chest pains, angina, palpitations, syncope, dyspnea on exertion, orthopnea, PND, peripheral edema, and claudication. Resp:  Denies dyspnea at rest, dyspnea with exercise, cough, sputum, wheezing, coughing up blood, and pleurisy. GI:  See HPI. GU:  Denies urinary burning, blood in urine, nocturnal urination, urinary frequency, and urinary incontinence. MS:  Denies joint pain / LOM,  joint swelling, joint stiffness, joint deformity, low  back pain, muscle weakness, muscle cramps, muscle atrophy, leg pain at night, leg pain with exertion, and shoulder pain / LOM hand / wrist pain (CTS). Derm:  Denies rash, itching, dry skin, hives, moles, warts, and unhealing ulcers. Psych:  Denies depression, anxiety, memory loss, suicidal ideation, hallucinations, paranoia, phobia, and confusion. Heme:  Denies bruising, bleeding, and enlarged lymph nodes.  Vital Signs:  Patient profile:   40 year old female Height:      65 inches Weight:      158 pounds BMI:     26.39 Temp:     97.7 degrees F oral Pulse rate:   88 / minute BP sitting:   100 / 72  (left arm) Cuff size:   regular  Vitals Entered By: Cloria Spring LPN (August 18, 2010 11:09 AM)  Physical Exam  General:  Well developed, well nourished, no acute distress. Head:  Normocephalic and atraumatic. Eyes:  sclera nonicteric Mouth:  Oropharyngeal mucosa moist, pink.  No lesions, erythema or exudate.    Neck:  Supple; no masses or thyromegaly. Lungs:  Clear throughout to auscultation. Heart:  Regular rate and rhythm; no murmurs, rubs,  or bruits. Abdomen:  normal bowel sounds, without guarding, without rebound, no hernia, no distesion, no tenderness, no masses, and no hepatomegally or splenomegaly.   Rectal:  deferred until time of colonoscopy.   Msk:  Symmetrical with no gross deformities. Normal posture. Extremities:  No clubbing, cyanosis, edema or deformities noted. Neurologic:  Alert and  oriented x4;  grossly normal neurologically. Skin:  Intact without significant lesions or rashes. Cervical Nodes:  No significant cervical adenopathy. Psych:  Alert and cooperative. Normal mood and affect.  Impression & Recommendations:  Problem # 1:  ABDOMINAL PAIN, EPIGASTRIC (ICD-789.06) Chronic abd pain with h/o chronic abd wall pain. Recent colonoscopy benign.,  ? etiology.  DIfferentials include PUD, dyspesia, functional abd pain, doubt occult malignancy.  Hx B-cell lymphoma,  has completed treatment at this point, in remission. Awaiting records from [facility] (Dr. Hoyle Sauer).    EGD to be performed by Dr. Jonathon Bellows in the near future.  I have discussed risks and benefits which include, but are not limited to, bleeding, infection, perforation, or medication reaction.  The patient agrees with this plan and consent will be obtained.  Problem # 2:  DUODENAL DIVERTICULUM (ICD-562.00)  Appended Document: REEVAL TO SEE IF EGD IS NEEDED/LAW Vicodin 5/500mg  1 by mouth q4-6 hrs as needed for severe pain, #20, no refills  Appended Document: Orders Update    Clinical Lists Changes  Problems: Added new problem of ABDOMINAL PAIN, CHRONIC (ICD-789.00) Added new problem of NAUSEA, CHRONIC (ICD-787.02) Orders: Added new Service order of Est. Patient Level III (41324) - Signed

## 2011-01-06 NOTE — Miscellaneous (Signed)
Summary: op notes  Clinical Lists Changes  NAME:  Donna Ramirez, Donna Ramirez                ACCOUNT NO.:  [id number]      MEDICAL RECORD NO.:  [id number]          PATIENT TYPE:  AMB      LOCATION:  DAY                           FACILITY:  [facility]      PHYSICIAN:  R. Roetta Sessions, M.D. DATE OF BIRTH:  1971/11/25      DATE OF PROCEDURE:  05/01/2008   DATE OF DISCHARGE:                                  OPERATIVE REPORT      Esophagogastroduodenoscopy with Elease Hashimoto dilation, followed by biopsy of   duodenum.      INDICATIONS FOR PROCEDURE:  A 40 year old lady with a 4-month history   of chronic nausea and dyspepsia.  A brief course of PPI therapy in the   way of OTC Prilosec has not really helped.  She has intermittent   esophageal dysphagia to solids.  She takes some Motrin on an   intermittent regular basis.  EGD is now being done.  Potential   esophageal dilation, biopsy, etc were reviewed with the patient.  Risks,   benefits, alternatives, and limitations have been reviewed and questions   were answered.  Please see the documentation in the medical record.      PROCEDURE NOTE:  O2 saturation, blood pressure, and pulse rate, and   respirations monitored throughout the entire procedure.      CONSCIOUS SEDATION:  Versed 4 mg IV and Demerol 100 g IV in divided   doses.  Cetacaine spray for topical pharyngeal anesthesia.      INSTRUMENT:  Pentax video chip system.      FINDINGS:  Examination of the tubular esophagus revealed noncritical   appearing Schatzki's ring and some tiny distal esophageal erosions   straddling the ring.  Otherwise, esophageal mucosa appeared   unremarkable.  EG junction easily traversed into his stomach.  Gastric   cavity was emptied, and insufflated well with air.  Thorough examination   of the gastric mucosa including retroflexed view of the proximal stomach   esophagogastric demonstrated only a small hiatal hernia.  Gastric mucosa   otherwise appeared entirely normal.   Pylorus was patent and easily   traversed.  Examination of the bulb, second, and third portion revealed   normal appearing ampulla and medial wall of second portion of the   duodenum.  There was a 1.5 cm pedunculated mass in the distal second   portion of the duodenum.  Please see photos.  Overlying mucosa had a   little different contour appearance, and the duodenum mucosa, there did   appear to be possibly submucosa component.  This does not appear to be   an out-and-out adenoma or neoplasm.      THERAPEUTIC/DIAGNOSTIC MANEUVERS PERFORMED:  Scope was withdrawn.  A 56-   French Maloney dilator was in full insertion with ease, look back   revealed.  No apparent complications related to passage of the dilator   subsequently.  Biopsy of the duodenum mass were taken.  I got two good   bites of this lesion with some  depth.  There was minimal bleeding.  The   patient tolerated the procedure well and was reactive in endoscopy.      IMPRESSION:   1. Distal esophageal erosions consistent with the mild reflux       esophagitis, noncritical appearing Schatzki's ring dilated as       described above, but otherwise, unremarkable esophagus, small       hiatal hernia, otherwise normal stomach and patent pylorus.  Normal       D1 and D2, and 1.5-cm pedunculated mass, distal D2 as described       above biopsy.      RECOMMENDATIONS:   1. Begin Aciphex 20 mg orally daily, literature on gastroesophageal       reflux disease providedto Ms. Burnett.   2. Follow up on biopsies.   3. The duodenum lesion may need further evaluation in the way of EUS       etc.               R. Roetta Sessions, M.D.   Electronically Signed            RMR/MEDQ  D:  05/01/2008  T:  05/01/2008  Job:  [id number]    SP-Surgical Pathology - STATUS: Final  .                                         Perform Date: [id number] 00:01  Ordered By: Jena Gauss MD , Gerrit Friends           Ordered Date: 27May09 14:08  Facility: [facility]                                Department: CPATH  Service Report Text  [facility]   [address] [place], [place] [address]   [phone]    REPORT OF SURGICAL PATHOLOGY    Case #: [id number]   Patient Name: Donna Ramirez, Donna Ramirez.   Office Chart Number: N/A    MRN: [id number]   Pathologist: Lyn Hollingshead. Delila Spence, MD   DOB/Age 10-05-1971 (Age: 42) Gender: F   Date Taken: 05/01/2008   Date Received: 05/01/2008    FINAL DIAGNOSIS    ***MICROSCOPIC EXAMINATION AND DIAGNOSIS***    DUODENUM, POLYPOID MASS, SECOND PART OF DUODENUM, BIOPSY:   - POLYPOID FRAGMENTS OF BENIGN COLONIC MUCOSA WITH   UNDERLYING PROMINENT   BRUNER' S TYPE GLANDS.   - NO ADENOMATOUS CHANGE OR MALIGNANCY IDENTIFIED.    cc   Date Reported: 05/02/2008 Alden Server A. Delila Spence, MD   *** Electronically Signed Out By EAA ***    Clinical information   Nausea and abdominal pain; esophageal ring (km)    specimen(s) obtained   Duodenum, biopsy, polypoid mass 2nd part of duodenum    Gross Description   Received in formalin are tan, soft tissue fragments that are   submitted in toto. Number: 2   Size: 0.3 cm (SHP:kv 05-02-08)    kv/   Additional Information  HL7 RESULT STATUS : F  External IF Update Timestamp : 2008-05-01:14:10:00.000000  CT Abdomen With Contrast - STATUS: Final  IMAGE  Perform Date: 30Apr09 16:45  Ordered By: DEFAULT MD , PROVIDER         Ordered Date: 30Apr09 16:11  Facility: [facility]                               Department: CT  Service Report Text  [facility] Accession Number: [id number]      Clinical Data: Right upper quadrant pain enlarged liver    CT ABDOMEN WITH CONTRAST    Technique:  Multidetector CT imaging of the abdomen was performed   following the standard protocol during bolus administration of   intravenous contrast.    Contrast: 100 ml Omnipaque-300    Comparison: None    Findings: Lung bases are clear.  No pleural or pericardial fluid.   The liver  appears within normal limits without evidence of focal   lesion or biliary ductal dilatation.  Gallbladder appears   unremarkable.  Size of the liver appears normal.  The spleen,   pancreas, adrenal glands and kidneys are normal.  The aorta and IVC   are normal.  No retroperitoneal mass or adenopathy.  No free   intraperitoneal fluid or air.  No bowel pathology seen in the   abdominal portion of the scan.    IMPRESSION:   Examination within normal limits.    Read By:  Thomasenia Sales,  M.D.   Released By:  Thomasenia Sales,  M.D.  Additional Information  HL7 RESULT STATUS : F  External image : [id number]  External IF Update Timestamp : 2008-04-04:16:56:31.000000   NM Hepato W/Eject Fract - STATUS: Final  IMAGE                                     Perform Date: 6 Jul09 10:00  Ordered By: Irving Shows,          Ordered Date: 6 Jul09 10:08  Facility: [facility]                               Department: NM  Service Report Text  [facility] Accession Number: [id number]      Clinical Data:  40 year old female with epigastric pain.    NUCLEAR MEDICINE HEPATOBILIARY IMAGING WITH GALLBLADDER EF    Technique:  Sequential images of the abdomen were obtained out to   60 minutes following intravenous administration of   radiopharmaceutical. After oral ingestion of 8oz of half and half   cream, gallbladder ejection fraction was determined.    Radiopharmaceutical:  FivemCi Tc-31m Choletec    Comparison: CT abdomen 04/04/2008.    Findings: Following radiotracer administration, there is prompt   uptake of the radiopharmaceutical within the liver and clearance of   the blood pool.  Biliary tree and gallbladder activity are seen   promptly by 20 minutes.  There is subsequent clearance of   radiotracer from the liver parenchyma and accumulation of activity   within the gallbladder and biliary tree.  Small bowel activity is   seen by the 50 - 60-minute mark.    Gallbladder ejection fraction was  then determined using region of   interest calculations following oral administration of 8 ounces of   half-and-half.  Over a period of 55 minutes, gallbladder ejection   fraction is calculated to be 46.5%.  Normal gallbladder  ejection   fraction is considered to be greater than 50% at 1 hour.    IMPRESSION:    1.  Normal liver activity, patency of the common bile duct and   cystic duct.   2.  Borderline gallbladder ejection fraction of 46.5% (normal   considered to be greater than 50%).    Read By:  Augusto Gamble,  M.D.   Released By:  Augusto Gamble,  M.D.  Additional Information  HL7 RESULT STATUS : F  External image : [id number]  External IF Update Timestamp : 2008-06-10:11:21:23.000000   US Abdomen Complete - STATUS: Final  IMAGE                                     Perform Date: 23Jul09 10:47  Ordered By: Jena Gauss MD , Gerrit Friends           Ordered Date: 23Jul09 09:58  Facility: [facility]                               Department: [place]  Service Report Text  [facility] Accession Number: [id number]      Clinical Data: Right upper quadrant pain    ABDOMEN ULTRASOUND    Technique:  Complete abdominal ultrasound examination was performed   including evaluation of the liver, gallbladder, bile ducts,   pancreas, kidneys, spleen, IVC, and abdominal aorta.    Comparison: None    Findings:   Gallbladder normally distended without stones or wall thickening.   No sonographic Murphy sign.   Common bile duct normal caliber, 2.4 mm diameter.   Mildly echogenic liver, question mild fatty infiltration, though   this can be seen with cirrhosis and certain infiltrative disorders.   Liver, pancreas, and spleen otherwise normal appearance, spleen 8.9   cm length.   Kidneys normal size and morphology, 11.0 cm length right and 11.2   cm length left.   Aorta and IVC normal.   No free fluid.    IMPRESSION:   Question mild fatty infiltration of liver.   Otherwise normal exam.    Read By:  Lollie Marrow,   M.D.   Released By:  Lollie Marrow,  M.D.  Additional Information  HL7 RESULT STATUS : F  External image : [id number]  External IF Update Timestamp : 2008-06-27:12:22:42.000000  DG Abdomen Acute W/Chest - STATUS: Final  IMAGE                                     Perform Date: 8 Oct09 15:47  Ordered By: Olga Millers Date: 8 Oct09 14:41  Facility: [facility]                               Department: DG  Service Report Text  [facility] Accession Number: [id number]      Clinical Data: Nausea and abdominal pain.    ACUTE ABDOMEN SERIES (ABDOMEN 2 VIEW & CHEST 1 VIEW)    Comparison: None.    Findings: Mild increased lung markings possibly chronic in origin   without evidence of a segmental infiltrate, pneumothorax or   pulmonary edema.  Heart size within normal limits.  No free  intraperitoneal air.  Minimal scoliosis thoracic spine.  No   evidence of bowel obstruction.    IMPRESSION:   No evidence of bowel obstruction or free intraperitoneal air.    Mild increased lung markings possibly chronic in origin.    Read By:  Fuller Canada,  M.D.   Released By:  Fuller Canada,  M.D.  Additional Information  HL7 RESULT STATUS : F  External image : [id number]  External IF Update Timestamp : 2008-09-12:15:54:16.000000

## 2011-01-06 NOTE — Letter (Signed)
Summary: PAIN CLINIC REFERRAL  PAIN CLINIC REFERRAL   Imported By: Ave Filter 09/30/2010 08:26:47  _____________________________________________________________________  External Attachment:    Type:   Image     Comment:   External Document

## 2011-01-06 NOTE — Letter (Signed)
Summary: AUTH FOR RELEASE OF MED INFO  AUTH FOR RELEASE OF MED INFO   Imported By: Rexene Alberts 10/15/2010 10:44:40  _____________________________________________________________________  External Attachment:    Type:   Image     Comment:   External Document

## 2011-01-06 NOTE — Letter (Signed)
Summary: RECORDS FROM [facility]  RECORDS FROM [facility]   Imported By: Rexene Alberts 08/27/2010 09:37:47  _____________________________________________________________________  External Attachment:    Type:   Image     Comment:   External Document

## 2011-01-06 NOTE — Letter (Signed)
Summary: [facility] - Office Assessment  [facility] - Office Assessment   Imported By: Marylou Mccoy 09/07/2010 13:09:32  _____________________________________________________________________  External Attachment:    Type:   Image     Comment:   External Document

## 2011-01-06 NOTE — Assessment & Plan Note (Signed)
Summary: ov in 2 months w/ext. per RMR/LAW   Primary Care Anya Murphey:  [facility])  CC:  F/U.  History of Present Illness: Donna Ramirez is a 40 year old African American female who has a history of chronic abdominal pain X 3 years, which she states followed her hysterectomy. She has completed a thorough work-up, with no defining etiology to her chronic pain. She is here to follow-up after endoscopy on 08/2010 with Dr. Jena Gauss, showed non-critical Schatzki ring and small gastric erosion, biopsy negative for malignant process. She continues to complain of epigastric pain, described as constant, "menstrual-like cramps". Not associated with eating or drinking. Physical activity worsens pain. +intermittent nausea. Reports BM every other day. No hematochezia or melena. Has been referred to PT in past but only went once; states did not help. Has tried myriad of other options such as Lidoderm patch, amitryptilline, hyoscyamine. states pain medication (narcotics) are the only agents that provide relief. Does report slight depression, hx of Cymbalta use but weaned self off. Denies anxiety. Diagnosed with lymphoma last year.   Current Medications (verified): 1)  Estratest .... Once Daily 2)  Levsin 0.125 Mg Tabs (Hyoscyamine Sulfate) .Marland Kitchen.. 1 By Mouth Ac/qhs As Needed (Up To Qid) For Abd Pain  Allergies (verified): No Known Drug Allergies  Past History:  Past Medical History: Current Problems: colonoscopy Dr Jena Gauss 8/26->hyperplastic polyp DEGENERATIVE DISC DISEASE (ICD-722.6) chronic abd pain, seen at [facility], 2009-->abd wall pain 08/19/10 EGD: Noncritical Schatzki ring, otherwise normal esophagus, small gastric erosions as described above, patent pylorus, subtle abnormality in the area of the ampulla of uncertain significance status post biopsy, otherwise D1 through D3 appeared normal. 07/30/10 [facility] Rectosigmoid polyps removed and/or ablatedotherwise normal rectum, colon, and terminal ileum.  EGD  5/09, Dr. Dellie Catholic RE, noncritical Schatzki's ring s/p dilatation, 1.5cm pedunculated mass distal D2 (bx negative) EUS, 6/09, Dr. Lauralyn Primes pedunculated lesion seen. 6-68mm nodule seen and had evidence of recent mucosal bx (2-3cm distal to major papilla). Bx benign. 1cm somewhat adenomatous mucos at major papilla bx(focal mild inflammation associated with sligh atypia). EGD at [facility] 10/09 was normal  degenerative disc disease hearing loss in childhood,cause unknown ascus on pap 03 with multi normal paps since non-hodgkins lymphoma marijunana abuse back pain chronic nausea mild fatty infiltration of the liver uterine fibroids, s/p hysterectomy   Review of Systems General:  Denies fever, chills, and anorexia. Eyes:  Denies blurring, irritation, and discharge. ENT:  Denies sore throat, hoarseness, and difficulty swallowing. CV:  Denies chest pains, syncope, and dyspnea on exertion. Resp:  Denies dyspnea at rest, cough, and wheezing. GI:  Complains of nausea and abdominal pain; denies difficulty swallowing and pain on swallowing. GU:  Denies urinary burning, blood in urine, and urinary frequency. MS:  Denies joint pain / LOM, joint stiffness, and joint deformity. Derm:  Denies rash, itching, and dry skin. Neuro:  Denies weakness, syncope, and headache. Psych:  Complains of depression; denies anxiety; mild.  Vital Signs:  Patient profile:   40 year old female Height:      65 inches Weight:      163 pounds BMI:     27.22 Temp:     98.1 degrees F oral Pulse rate:   76 / minute BP sitting:   124 / 82  (left arm) Cuff size:   regular  Vitals Entered By: Cloria Spring LPN (September 29, 2010 11:02 AM)  Physical Exam  General:  Well developed, well nourished, no acute distress. Head:  Normocephalic and atraumatic. Mouth:  No deformity or lesions, dentition normal. Lungs:  Clear throughout to auscultation. Heart:  Regular rate and rhythm; no murmurs, rubs,  or  bruits. Abdomen:  normal bowel sounds, epigastric tenderness, without guarding, without rebound, no masses, and no hepatomegally or splenomegaly.   Msk:  Symmetrical with no gross deformities. Normal posture. Extremities:  No clubbing, cyanosis, edema or deformities noted. Neurologic:  Alert and  oriented x4;  grossly normal neurologically.  Impression & Recommendations:  Problem # 1:  ABDOMINAL PAIN, CHRONIC (ICD-789.00) Assessment Unchanged  40 year old female with long-standing history (3 years) of chronic abdominal pain, specificallyy epigastric, that has been evaluated thoroughly. Following up from endoscopy 08/2010 which showed noncritical Schatzki ring and small gastric erosion. Path benign for malignant process. Not currently on a PPI.  Prilosec 20 mg daily, 14 samples given, will send Rx to pharmacy of choice Referral to Pain Management for further evaluation no narcotics distributed despite patient's request, pt informed this would need to be managed by PCP or pain management.   Orders: Est. Patient Level III ([address])  Problem # 2:  NAUSEA, CHRONIC (ICD-787.02)  Likely related to chronic abdominal pain.   See #1.   Orders: Est. Patient Level III ([address])  Medications Added to Medication List This Visit: 1)  Prilosec 20 Mg Cpdr (Omeprazole) .... Take one by mouth daily  Patient Instructions: 1)  Prilosec 20 mg daily, rx sent to pharmacy 2)  Referral to pain management Prescriptions: PRILOSEC 20 MG CPDR (OMEPRAZOLE) take one by mouth daily  #30 x 3   Entered and Authorized by:   Gerrit Halls NP   Signed by:   Gerrit Halls NP on 09/29/2010   Method used:   Faxed to ...       [facility] (retail)       [address]       [place], [place]  [address]       Ph: [phone]       Fax: [phone]   RxID:   [id number]

## 2011-01-06 NOTE — Assessment & Plan Note (Signed)
Summary: ABD PAIN/CONSTIPATION/LAW   Visit Type:  Initial Visit Primary Care Provider:  [facility], [facility]  Chief Complaint:  abd pain and constipation.  History of Present Illness: 40 year old lady with a history of  non-Hodgkin's lymphoma and long-standing chronic abdominal wall pain. She has been extensively evaluated. Her evaluation was well-chronicled in our 1025/11 note regarding her office visit here. She was a no show x3 for physical therapy and was discharged. She is slated to see a pain management specialist later this month - she is vague on the details. She is chronically constipated -  doesn't  want any narcotics. Her reflux symptoms are well controlled on Prilosec 20 mg orally daily. She tells me she take some of her friends Carafate recently and this seemed to relieve some of her abdominal symptoms. Her lymphoma reportedly in remission; she is followed closely don at [facility].  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 Once Daily 2)  Levsin 0.125 Mg Tabs (Hyoscyamine Sulfate) .Marland Kitchen.. 1 By Mouth Ac/qhs As Needed (Up To Qid) For Abd Pain 3)  Prilosec 20 Mg Cpdr (Omeprazole) .... Take One By Mouth Daily  Allergies (verified): No Known Drug Allergies  Past History:  Past Medical History: Last updated: 09/29/2010 Current Problems: colonoscopy Dr Jena Gauss 8/26->hyperplastic polyp DEGENERATIVE DISC DISEASE (ICD-722.6) chronic abd pain, seen at [facility], 2009-->abd wall pain 08/19/10 EGD: Noncritical Schatzki ring, otherwise normal  esophagus, small gastric erosions as described above, patent pylorus, subtle abnormality in the area of the ampulla of uncertain significance status post biopsy, otherwise D1 through D3 appeared normal. 07/30/10 [facility] Rectosigmoid polyps removed and/or ablatedotherwise normal rectum, colon, and terminal ileum.  EGD 5/09, Dr. Dellie Catholic RE, noncritical Schatzki's ring s/p dilatation, 1.5cm pedunculated mass distal D2 (bx negative) EUS, 6/09, Dr. Lauralyn Primes pedunculated lesion seen. 6-32mm nodule seen and had evidence of recent mucosal bx (2-3cm distal to major papilla). Bx benign. 1cm somewhat adenomatous mucos at major papilla bx(focal mild inflammation associated with sligh atypia). EGD at [facility] 10/09 was normal  degenerative disc disease hearing loss in childhood,cause unknown ascus on pap 03 with multi normal paps since non-hodgkins lymphoma marijunana abuse back pain chronic nausea mild fatty infiltration of the liver uterine fibroids, s/p hysterectomy  Family History: Last updated: 07/28/2010 Family History of Breast Cancer: Mother age 23 Family History of Colon Cancer: questionable PGM, questionable great Uncle/Aunt Family History of Prostate Cancer: Father Family History of Diabetes: MGM, Aunt Aunt, ischemic bowel Father, colon polyps  Social History: Last updated: 07/28/2010 Disabled. 1/2ppd, trying to cut-back. Rare alcohol. Some marijuana use, once per week. No IV/intranasal drugs. No children. In relationship, with girlfriend.  Past Surgical History: BSO 11/08 Hysterectomy 11/08 Lymph node bx, lymphoma port removed  Vital Signs:  Patient profile:   40 year old female Height:      65 inches Weight:      164 pounds BMI:     27.39 Temp:     97.4 degrees F oral Pulse rate:   64 / minute BP sitting:   124 / 84  (left arm) Cuff size:   regular  Vitals Entered By: Hendricks Limes LPN (November 05, 2010 10:31 AM)  Physical Exam  General:  a pleasant alert  lady in no acute distress accompanied by her girlfriend. Eyes:  no scleral icterus. Abdomen:  flat positive bowel sounds soft, nontender without appreciable mass or organomegaly  Impression & Recommendations: Impression: Chronic abdominal wall pain in the setting of non-Hodgkin's lymphoma. Extensive workup here and elsewhere negative for any other cause of abdominal pain. She would like to try prescription for Carafate. This is not on reasonable.  I'm glad to see that she wants to avoid narcotics and is seeing a pain management specialist in the near future.  Recommendations: Continue Prilosec 20 mg orally daily.  New prescription for Carafate suspension dispense 1 pint 1 g orally q.i.d. with 5 refills.  As far as constipation is concerned, utilize MiraLax 17 g orally at bedtime p.r.n. no bowel movement on any given day.  Office visit here in 6 months.  Appended Document: Orders Update    Clinical Lists Changes  Problems: Added new problem of CONSTIPATION (ICD-564.00) Orders: Added new Service order of Est. Patient Level III ([address]) - Signed

## 2011-01-06 NOTE — Letter (Signed)
Summary: Patient Notice, Colon Biopsy Results  [facility]  [address]   [place], [place] [address]   Phone: [phone]  Fax: [phone]       August 04, 2010   Donna Ramirez [address] [place], [place]  [address] [birth year]    Dear [name],  I am pleased to inform you that the biopsies taken during your recent colonoscopy did not show any evidence of cancer upon pathologic examination.  Additional information/recommendations:  No further action is needed at this time.  Please follow-up with your primary care physician for your other healthcare needs.  You should have a repeat colonoscopy examination  in 5 years.  Please call us if you are having persistent problems or have questions about your condition that have not been fully answered at this time.  Sincerely,    R. Roetta Sessions MD, FACP [facility] Ph: [phone]    Fax: [phone]   Appended Document: Patient Notice, Colon Biopsy Results letter mailed to pt  Appended Document: Patient Notice, Colon Biopsy Results reminder in computer

## 2011-01-06 NOTE — Letter (Signed)
Summary: Recall, Labs Needed  [facility]  [address]   [place], [place] [address]   Phone: [phone]  Fax: [phone]    August 06, 2010  NITISHA CIVELLO [address] [place], [place]  [address] Mar 31, 1971   Dear Ms. Hilario,   Our records indicate it is time to repeat your blood work.  You can take the enclosed form to the lab on or near the date indicated.  Please make note of the new location of the lab:   [address], 2nd floor   [facility]  Our office will call you within a week to ten business days with the results.  If you do not hear from [place] in 10 business days, you should call the office.  If you have any questions regarding this, call the office at [phone], and ask for the nurse.  Labs are due on 08/24/2010.   Sincerely,    Hendricks Limes LPN  [facility] Ph: [phone]   Fax: [phone]

## 2011-01-06 NOTE — Progress Notes (Signed)
Summary: phone note/ still having abd pain  Phone Note Call from Patient   Caller: Patient Summary of Call: Pt called for results from her TCS. Gave her the results from path. She said she is still having abd pain, constantly.  Almost out of pain pills. Wants to know what next step will be. Please advise! Initial call taken by: Cloria Spring LPN,  August 06, 2010 1:16 PM     Appended Document: phone note/ still having abd pain See last addendum to ov note. Per RMR, since EGD never got added on as requested, he advised OV to reevaluate patient and determine if EGD still needed. He recommended in 3-4 weeks. If pt's pain is stable, consider waiting at least two weeks prior to OV otherwise OV next week.   Please re-request records from [facility], requested day of OV.  Vicodin 5/500mg  #20 one by mouth every 4-6 hours as needed pain. 0 refills.  Appended Document: phone note/ still having abd pain pt aware, called in Rx to [facility].  Appended Document: phone note/ still having abd pain Second request for MR on 08-11-10

## 2011-01-06 NOTE — Letter (Signed)
Summary: EGD ORDER  EGD ORDER   Imported By: Ave Filter 08/18/2010 12:07:37  _____________________________________________________________________  External Attachment:    Type:   Image     Comment:   External Document

## 2011-01-07 NOTE — Letter (Signed)
Summary: CLINIC NOTE  CLINIC NOTE   Imported By: Rexene Alberts 12/01/2010 11:59:51  _____________________________________________________________________  External Attachment:    Type:   Image     Comment:   External Document

## 2011-01-07 NOTE — Letter (Signed)
Summary: OP REPORT  OP REPORT   Imported By: Rexene Alberts 12/01/2010 11:57:33  _____________________________________________________________________  External Attachment:    Type:   Image     Comment:   External Document

## 2011-01-07 NOTE — Progress Notes (Signed)
Summary: phone note/ requests pills of Carafate/ liquid makes sick  Phone Note Call from Patient   Caller: Patient Summary of Call: Pt called and said the Carafate (liquid) is making her very sick on the stomach. Keeps her up all night, she can't sleep unless she takes something to make her sleep. She said she has tried the tablets from a friend and did not experience the same side effects. Would like some tablets called to [facility] in [place]. Please advise! Initial call taken by: Cloria Spring LPN,  November 18, 2010 9:39 AM     Appended Document: phone note/ requests pills of Carafate/ liquid makes sick ok; may change Rx tao tablet - same instructions / refills / stop suspension  Appended Document: phone note/ requests pills of Carafate/ liquid makes sick called changes to [facility]

## 2011-01-07 NOTE — Letter (Signed)
Summary: OFFICE NOTE FROM DR [facility]  OFFICE NOTE FROM DR [facility]   Imported By: Rexene Alberts 12/14/2010 09:40:09  _____________________________________________________________________  External Attachment:    Type:   Image     Comment:   External Document

## 2011-01-07 NOTE — Letter (Signed)
Summary: RADIOLOGY REPORTS FROM [facility]  RADIOLOGY REPORTS FROM [facility]   Imported By: Rexene Alberts 12/01/2010 11:53:51  _____________________________________________________________________  External Attachment:    Type:   Image     Comment:   External Document

## 2011-01-07 NOTE — Progress Notes (Signed)
Summary: approved for pain management  Phone Note From Other Clinic   Summary of Call: Dr Ronal Fear office called to let us know that patient has been approved for pain management and her appt is 12/09/10 @ 3pm Initial call taken by: Diana Eves,  November 17, 2010 4:18 PM

## 2011-01-26 ENCOUNTER — Ambulatory Visit: Payer: Medicaid Other | Attending: Neurology

## 2011-01-26 DIAGNOSIS — G4710 Hypersomnia, unspecified: Secondary | ICD-10-CM | POA: Insufficient documentation

## 2011-01-26 DIAGNOSIS — G4730 Sleep apnea, unspecified: Secondary | ICD-10-CM | POA: Insufficient documentation

## 2011-02-03 ENCOUNTER — Other Ambulatory Visit: Payer: Self-pay | Admitting: Neurology

## 2011-02-03 DIAGNOSIS — M545 Low back pain: Secondary | ICD-10-CM

## 2011-02-09 ENCOUNTER — Ambulatory Visit (HOSPITAL_COMMUNITY)
Admission: RE | Admit: 2011-02-09 | Discharge: 2011-02-09 | Disposition: A | Discharge status: Home / Self Care | Payer: Medicaid Other | Source: Ambulatory Visit | Attending: Neurology | Admitting: Neurology

## 2011-02-09 DIAGNOSIS — M545 Low back pain, unspecified: Secondary | ICD-10-CM | POA: Insufficient documentation

## 2011-02-09 DIAGNOSIS — M79609 Pain in unspecified limb: Secondary | ICD-10-CM | POA: Insufficient documentation

## 2011-02-09 DIAGNOSIS — Z87898 Personal history of other specified conditions: Secondary | ICD-10-CM | POA: Insufficient documentation

## 2011-02-09 MED ORDER — GADOBENATE DIMEGLUMINE 529 MG/ML IV SOLN
15.0000 mL | Freq: Once | INTRAVENOUS | Status: AC | PRN
  Administered 2011-02-09: 15 mL via INTRAVENOUS

## 2011-03-15 LAB — CBC
MCHC: 35.2 g/dL (ref 30.0–36.0)
RBC: 4.33 MIL/uL (ref 3.87–5.11)
RDW: 12.7 % (ref 11.5–15.5)

## 2011-03-15 LAB — DIFFERENTIAL
Basophils Absolute: 0 10*3/uL (ref 0.0–0.1)
Basophils Relative: 1 % (ref 0–1)
Neutro Abs: 2.5 10*3/uL (ref 1.7–7.7)
Neutrophils Relative %: 52 % (ref 43–77)

## 2011-03-15 LAB — URINALYSIS, ROUTINE W REFLEX MICROSCOPIC
Hgb urine dipstick: NEGATIVE
Protein, ur: NEGATIVE mg/dL
Urobilinogen, UA: 1 mg/dL (ref 0.0–1.0)

## 2011-03-15 LAB — BASIC METABOLIC PANEL
CO2: 29 mEq/L (ref 19–32)
Calcium: 9.1 mg/dL (ref 8.4–10.5)
Creatinine, Ser: 0.74 mg/dL (ref 0.4–1.2)
GFR calc Af Amer: >60 mL/min (ref >60)
Glucose, Bld: 94 mg/dL (ref 70–99)

## 2011-04-07 ENCOUNTER — Other Ambulatory Visit: Payer: Self-pay | Admitting: Neurology

## 2011-04-07 ENCOUNTER — Ambulatory Visit (HOSPITAL_COMMUNITY)
Admission: RE | Admit: 2011-04-07 | Discharge: 2011-04-07 | Disposition: A | Discharge status: Home / Self Care | Payer: Medicaid Other | Source: Ambulatory Visit | Attending: Neurology | Admitting: Neurology

## 2011-04-07 DIAGNOSIS — M546 Pain in thoracic spine: Secondary | ICD-10-CM | POA: Insufficient documentation

## 2011-04-07 DIAGNOSIS — M542 Cervicalgia: Secondary | ICD-10-CM

## 2011-04-07 DIAGNOSIS — M549 Dorsalgia, unspecified: Secondary | ICD-10-CM

## 2011-04-20 NOTE — H&P (Signed)
Donna, Ramirez NO.:  [id number]   MEDICAL RECORD NO.:  [id number]          PATIENT TYPE:  INP   LOCATION:  A320                          FACILITY:  [facility]   PHYSICIAN:  Lazaro Arms, M.D.   DATE OF BIRTH:  [birth year]   DATE OF ADMISSION:  DATE OF DISCHARGE:  11/14/2008LH                              HISTORY & PHYSICAL   HISTORY OF PRESENT ILLNESS:  Donna Ramirez is a 40 year old female, gravida 0,  para 0 who has been seen in our office for the last several months  because of pelvic pain.  She was originally referred from [facility].  She had an ultrasound which showed some small  fibroids.  Ovaries were basically normal.  She complains of unbearable  menstrual periods with unbearable pain during them, heavy bleeding, back  ache, worse with menses.  Pain has gotten worse over the last 9-10  months.  Patient not sexually active.  She understands will surrender  her, of course, sterile.  She also has pain in the left side, minimal to  none on the right.  As a result, she is admitted for TAH LSO.   PAST MEDICAL HISTORY:  Seen for depression.   PAST SURGICAL HISTORY:  Negative .   ALLERGIES:  None.   MEDICATIONS:  Cymbalta.  The patient smokes a pack of cigarettes a day.   REVIEW OF SYSTEMS:  As per HPI.  Otherwise, negative.   PHYSICAL EXAMINATION:  VITAL SIGNS:  Weight 168 pounds, blood pressure  120/80.  HEENT:  Unremarkable.  Thyroid is normal.  LUNGS:  Clear.  HEART:  Regular rate and rhythm with murmurs, rubs or gallops.  BREASTS:  Without mass or discharge, skin changes.  ABDOMEN:  Benign without hepatosplenomegaly or masses.  She has normal  external genitalia .  Vagina is pink and moist without discharge.  Cervix is nulliparous without lesions.  Uterus is 10 week size, tender.  Left adnexa is tender but normal size, right adnexa is negative.   IMPRESSION:  1. Fibroid uterus.  2. Menometrorrhagia.  3. Dysmenorrhea.  4. Left  lower quadrant pain.   PLAN:  The patient is admitted for TAH LSO.  She understands the risks,  benefits, indications, alternatives and will proceed.      Lazaro Arms, M.D.  Electronically Signed     LHE/MEDQ  D:  10/17/2007  T:  10/18/2007  Job:  [id number]

## 2011-04-20 NOTE — Assessment & Plan Note (Signed)
Donna Ramirez, Donna Ramirez                 CHART#:  [id number]   DATE:  09/17/2008                       DOB:  06-Sep-1971   PRIMARY CARE PHYSICIAN:  [place].   CHIEF COMPLAINT:  Followup chronic abdominal wall pain.   PROBLEM LIST:  1. Chronic abdominal wall pain.  The patient has been evaluated at      [facility].  Please see note of      Dr. Waldon Merl on 08/26/2008.  2. Normal EGD by Dr. Margaretha Glassing at [facility] on 09/06/2008.  3. EGD by Dr. Jena Gauss, which showed mild reflux esophagitis, noncritical      Schatzki ring, and 1.5-cm pedunculated mass of distal D2.  Biopsies      were benign.  4. EUS by Dr. Christella Hartigan on 05/23/2008.  Ampullary mucosa showed focal      mild inflammation associated with slight atypia.  No invasive      malignancy identified.  5. Mild fatty infiltration of the liver.  6. Chronic back pain.  7. Degenerative disk disease.  8. Complete hysterectomy for uterine fibroids.  9. Depression.   OBJECTIVE:  The patient is a 40 year old female with history as above.  She tells me she continued to have chronic mid upper abdominal pain, is  usually all day long and is worse with movement.  She occasionally has  heartburn and indigestion.  She has not been on PPI.  She has had some  transient nausea.  Denies any vomiting.  Denies any rectal bleeding or  melena.  Her weight is steadily increasing.  She has no family history  of colorectal carcinoma, but history of adenomatous polyps in multiple  family members.  She have questions about when she should have a  colonoscopy.  She has not had any warning signs.  She occasionally has  constipation.   CURRENT MEDICATIONS:  See the list from September 17, 2008.   ALLERGIES:  No known drug allergies.   OBJECTIVE:  VITAL SIGNS:  Weight 177 pounds, height 65 inches,  temperature 97.9, blood pressure 122/90, and pulse 84.  GENERAL:  She is a  well-developed and well-nourished [place]  female, in no acute distress.  HEENT:  Sclerae clear and nonicteric.  Conjunctivae pink.  Oropharynx  pink and moist without any lesions.  CHEST:  Heart regular rate and rhythm.  Normal S1 and S2.  ABDOMEN:  Protuberant with positive bowel sounds x4.  No bruits  auscultated.  Soft, nontender, and nondistended without palpable mass or  hepatosplenomegaly.  No rebound, tenderness, or guarding.  EXTREMITIES:  Without clubbing or edema.   ASSESSMENT:  1. Abdominal wall pain.  We will try topical Lidoderm and physical      therapy, initially to strengthen core muscles, which may help      lessen her abdominal wall pain.  2. Duodenal atypia, benign as above.  3. Complicated gastroesophageal reflux disease.  She needs to resume      proton pump inhibitor.  4. She does have constipation at times.   PLAN:  1. Lidoderm 5% patches applied to affected area, #90 with 3 refills.  2. Request physical therapy.  Consult for abdominal wall pain.  3. Colonoscopy at age 67.  This has been discussed  with Dr. Jena Gauss and      he agrees with this plan.  4. MiraLax 17 g daily as needed for constipation.  5. Office visit in 3 months and to follow up with Dr. Jena Gauss.       Lorenza Burton, N.P.  Electronically Signed     R. Roetta Sessions, M.D.  Electronically Signed    KJ/MEDQ  D:  09/17/2008  T:  09/18/2008  Job:  [id number]

## 2011-04-20 NOTE — Assessment & Plan Note (Signed)
NAMELEKESHIA, KRAM                 CHART#:  [id number]   DATE:  06/03/2008                       DOB:  12/22/1970   CHIEF COMPLAINT:  Abdominal pain.   PROBLEM LIST:  1. Duodenal lesion found on EGD evaluated by EUS by Dr. Christella Hartigan on      05/23/2008.  Biopsies shows focal mild inflammation associated with      slight atypia further studies pending.  2. Erosive reflux esophagitis, last EGD 05/01/2008 by Dr. Jena Gauss.  3. Noncritical Schatzki's ring, status post dilatation with a 56-      [place] Maloney dilator on 05/01/2008.  4. Chronic back pain.  5. Degenerative disk disease.  6. Status post complete hysterectomy for uterine fibroids.   SUBJECTIVE:  The patient is a 40 year old African American female who  continues to complain of epigastric pain.  She rates the pain 7/10 on  pain scale.  She does have nausea.  Denies any vomiting.  Because she  has been taking Phenergan, she does feel quite sick.  This morning she  had pancakes and bacon.  She did develop severe epigastric pain.  It is  not necessarily always associated with eating, but at times it is made  worse by eating.  Her pain usually last anywhere from minutes to hours.  She describes the pain as an aching pain.  She is taking Kapidex 60 mg  daily.  It does not seem to help her pain.  She previously felt Aciphex  worked much better.  Her weight is up 3 pounds since she was seen 1  month ago.   CURRENT MEDICATIONS:  See the list from June 03, 2008.   ALLERGIES:  No known drug allergies.   PHYSICAL EXAMINATION:  VITAL SIGNS:  Weight her 174 pounds, height 65  inches, temperature 97.6, blood pressure 120/88, and pulse 76.  GENERAL:  The patient is well-developed, well-nourished African American  female in no acute distress.  HEENT:  Sclerae clear. Nonicteric. Conjunctivae pink. Oropharynx pink  and moist without any lesions.  CHEST:  Heart regular rate and rhythm.  Normal S1 and S2.  ABDOMEN:  Positive bowel sounds x4.   No bruits auscultated.  Soft,  nontender and nondistended without palpable mass or hepatosplenomegaly.  No rebound, tenderness, or guarding.  EXTREMITIES:  Without clubbing or edema.   ASSESSMENT:  The patient is a 40 year old African American female with  history of erosive reflux esophagitis.   She also has history of duodenal lesion with previous EUS by Dr. Christella Hartigan  and biopsies thus far benign.   I wonder, if she does not have gallbladder etiology as a culprit of her  symptoms.  She has had a CT of the abdomen with contrast, which did not  show cholelithiasis.   PLAN:  1. Check CBC, amylase, lipase and LFTs.  2. Aciphex 20 mg daily.  I have given her 1 months' worth of samples.  3. HIDA scan in the near future and we will be contacting her with      results.       Lorenza Burton, N.P.  Electronically Signed     Kassie Mends, M.D.  Electronically Signed    KJ/MEDQ  D:  06/03/2008  T:  06/04/2008  Job:  [id number]   cc:   Bernestine Amass

## 2011-04-20 NOTE — H&P (Signed)
NAME:  Donna Ramirez, Donna Ramirez                ACCOUNT NO.:  [id number]   MEDICAL RECORD NO.:  [id number]          PATIENT TYPE:  AMB   LOCATION:  DAY                           FACILITY:  [facility]   PHYSICIAN:  R. Roetta Sessions, M.D. DATE OF BIRTH:  March 14, 1971   DATE OF ADMISSION:  DATE OF DISCHARGE:  LH                              HISTORY & PHYSICAL   NEW PATIENT HISTORY AND PHYSICAL   PRIMARY CARE PHYSICIAN:  [place].   CHIEF COMPLAINT:  Chronic nausea and abdominal pain.   HISTORY OF PRESENT ILLNESS:  Donna Ramirez is a self-referred 40 year old  African American female.  She has had a 18-month history of chronic  nausea.  She does have abdominal pain with the nausea.  She tells me  that the pain is mostly in her upper abdomen.  It is worse with lifting.  It is a constant pain.  She complains of nausea but has not had any  vomiting.  She has been taking over-the-counter Nauzene.  Denies any  heartburn or indigestion.  She did try a month trial of Prilosec which  did not seem to alleviate the pain nor the nausea.  She has tried Pepcid  AC b.i.d. for at least 10 days now.  She is noting intermittent  dysphagia with solid food.  She feels as though her food gets stuck  midsternally.  She denies any odynophagia.  She denies any problems with  liquids.  She complains of significant postprandial urgency.  She  generally has a bowel movement every day or every-other day but they  have been looser than normal recently.  She has been having hot flashes  since she underwent a complete hysterectomy due to uterine fibroids  November 2008.  She denies any fever.  She has been taking Motrin 400-  600 mg q.4-6h. as needed for the abdominal pain and some chronic back  pain as well.   She had a CT scan of the abdomen with contrast April 04, 2008, which was  normal.  Apr 14, 2008, she had a hemoglobin of 15.3, hematocrit 45,  sodium 139, potassium 3.8, chloride 105, glucose 116, BUN 18, creatinine  0.8  and a urinalysis which showed moderate blood and high specific  gravity.   PAST MEDICAL AND SURGICAL HISTORY:  Chronic back pain due to  degenerative disk disease.  She has a history of complete hysterectomy  due to uterine fibroids November 2008.   CURRENT MEDICATIONS:  1. Cymbalta 60 mg daily.  2. Estradiol 2 mg daily.  3. Motrin 200-400 mg q.4-6h. p.r.n.   ALLERGIES:  No known drug allergies.   FAMILY HISTORY:  There is no known family history of colorectal  carcinoma, liver or chronic GI problems.  Mother has history of breast  cancer.  Father with prostate problems.  Two healthy siblings.   SOCIAL HISTORY:  Donna Ramirez is single.  She lives alone.  She has been  unemployed since November 2008.  She was previously a CNA.  She has a 30  pack-year history of tobacco use.  Denies any alcohol or drug use.  REVIEW OF SYSTEMS:  See HPI.  CONSTITUTIONAL:  Her weight has remained  stable.  Otherwise, negative review of systems.   PHYSICAL EXAMINATION:  VITAL SIGNS:  Weight 171 pounds, height 65  inches, temperature 97.9, blood pressure 120/84, pulse 64.  GENERAL:  Donna Ramirez is a well-developed, well-nourished African  American female in no acute distress.  HEENT:  Sclerae clear, nonicteric.  Conjunctivae pink.  Oropharynx pink  and moist.  She does have a small 0.5 cm cystic lesion just to the right  of the patient's uvula.  There are no exudates.  Posterior pharynx is  clear.  NECK:  Supple without any mass or thyromegaly.  CHEST:  Heart regular rate and rhythm.  Normal S1, S2, without any  murmurs, clicks, rubs or gallops.  LUNGS: Clear to auscultation bilaterally.  ABDOMEN:  Positive bowel sounds x4.  No bruits auscultated.  Soft,  nontender, nondistended, without palpable mass or hepatosplenomegaly.  No rebound tenderness or guarding.  EXTREMITIES:  Without clubbing or edema bilaterally.  SKIN:  Brown, warm and dry without any rash or jaundice.   IMPRESSION:  Donna Ramirez is  a 40 year old female with a 43-month history  of chronic nausea and dyspepsia.  No real response to a trial of PPI.  She does also note intermittent dysphagia to solid foods.  This is going  to require further evaluation to rule out peptic ulcer disease,  gastritis, or complicated gastroesophageal reflux disease including the  development of a web ring or stricture.  She is also on a significant  amount of Motrin making peptic ulcer disease quite possible.   She also has a cystic lesion just right of her uvula which I would like  her to follow up with her primary care doctor and she will do this.   As far as her postprandial urgency and loose stools are concerned, she  may have an element of irritable bowel syndrome versus NSAID-induced  diarrhea..  Her diarrhea is not on daily basis.  Therefore, I do not  feel this is infectious.   PLAN:  1. Lesion to the right of the uvula, to be followed by primary care      physician.  2. Phenergan 25 mg q.8h. p.r.n. nausea and vomiting,  #15 with no      refills.  3. EGD with Dr. Jena Gauss in the near future.  I discussed the procedure      including risks and benefits which include but are not limited to      bleeding, infection, perforation, drug reaction.  She agrees to the      plan and consent will be obtained.  4. She is to limit her use of Motrin.  5. She may benefit from a different PPI.  However, will await findings      of the EGD first.     Lorenza Burton, N.P.      Jonathon Bellows, M.D.  Electronically Signed   KJ/MEDQ  D:  04/26/2008  T:  04/26/2008  Job:  [id number]

## 2011-04-20 NOTE — Op Note (Signed)
Donna Ramirez, Donna Ramirez                ACCOUNT NO.:  [id number]   MEDICAL RECORD NO.:  [id number]          PATIENT TYPE:  INP   LOCATION:  A320                          FACILITY:  [facility]   PHYSICIAN:  Lazaro Arms, M.D.   DATE OF BIRTH:  Jun 07, 1971   DATE OF PROCEDURE:  10/18/2007  DATE OF DISCHARGE:                               OPERATIVE REPORT   PREOPERATIVE DIAGNOSES:  1. Menometrorrhagia.  2. Dysmenorrhea.  3. Fibroid uterus.  4. Left lower quadrant pain.   POSTOPERATIVE DIAGNOSES:  1. Menometrorrhagia.  2. Dysmenorrhea.  3. Fibroid uterus.  4. Left lower quadrant pain.  5. Bilateral ovarian endometriosis.   OPERATION PERFORMED:   SURGEON:  Lazaro Arms, M.D.   ANESTHESIA:  General endotracheal.   FINDINGS:  The patient had multiple relatively small fibroids on the  uterus.  She did, however, have endometriosis on both ovaries.  There  was none found on the peritoneal surfaces.  We talked about  preoperatively if we found anything else going on with the right side.  We were already planning to take the left side out but if we found  anything going on with the right ovary, we would remove that as well and  with endometriosis, I felt it in her best interests to do that.   DESCRIPTION OF PROCEDURE:  The patient was taken to the operating room  and placed in supine position where she underwent general endotracheal  anesthesia.  The vagina was prepped.  A Foley catheter was placed.  The  abdomen was prepped and draped in the usual sterile fashion.  A small  Pfannenstiel skin incision was made and carried down sharply to the  rectus fascia, scored in the midline, extended laterally.  Fascia was  taken off the muscle superiorly and inferiorly without difficulty.  The  muscles were divided, peritoneal cavity was entered.  An Alexis self-  retaining wound retractor was placed.  The upper abdomen was packed  away.  The uterine cornu were grasped.  The round ligament was  suture  ligated and cut bilaterally.  The left infundibulopelvic ligament was  clamped, cut and suture ligated.  She had some endometriosis on it.  This was probably the source of her pain.  There was no adhesive disease  or any other problems.  She also had some on the right ovary and I  decided at this point it was in her best interests to remove that based  on our preoperative talk.  The right infundibulopelvic ligament was  clamped, cut and suture ligated and the right ovary was removed.  The  vesicouterine serosal fat was created.  The uterine vessels were  clamped, cut and suture ligated.  Serial pedicles were taken down, the  cervix to the cardinal ligament, each pedicle being clamped, cut and  suture ligated.  Vagina was cross-clamped, specimen was removed.  Vaginal angle sutures were placed.  The vagina was closed with  interrupted figure-of-eight sutures.  The pelvis was irrigated  vigorously.  All pedicles found to be hemostatic.  Interceed was placed  over the vaginal cuff.  The packs were removed.  The Alexis wound  retractor was removed.  The peritoneum and muscles reapproximated  loosely.  The fascia was closed using 0 Vicryl running.  Subcutaneous  tissue was made hemostatic and irrigated.  The skin was closed using 3-0  Vicryl in a subcuticular fashion and Dermabond was placed for additional  adhesion and as a bandage.  The patient tolerated the procedure well.  She experienced 100 mL of blood loss, taken to recovery in good and  stable condition.  All counts correct x3.      Lazaro Arms, M.D.  Electronically Signed     LHE/MEDQ  D:  10/18/2007  T:  10/19/2007  Job:  [id number]

## 2011-04-20 NOTE — Assessment & Plan Note (Signed)
NAMECANARY, FISTER                 CHART#:  [id number]   DATE:  06/25/2008                       DOB:  10-29-1971   Followup epigastric right upper quadrant abdominal pain.  Symptoms have  been ongoing for nearly a year.  She has had an extensive evaluation  including CT, EUS, and EGD.  Duodenal nodule found at prior EGD biopsies  by me and Dr. __________  demonstrating a significant pathology,  noncritical Schatzki ring status post dilation previously, chronic back  pain, degenerative joint disease, status post hysterectomy.  She  continues to have symptoms.  Her oral intake is maintained.  She has  this pain all the time.  It is epigastric right upper quadrant, not  affected by position, eating, or fasting.  It is there all the time.  She is having trouble sleeping because of it.  She never developed a  rash.  She really does not have a radicular component.  They went ahead  and did a HIDA with fatty meals which demonstrated a gallbladder EF of  46.5%, but no reproduction of the patient's symptoms.  I have reviewed  the chart, and although she had a CT and a EUS, she has not had a  transabdominal ultrasound and that needs to be done.  I told the patient  today that she may end up getting a tertiary referral over to [facility].   CURRENT MEDICATIONS:  See updated list.  She does have been complaining  of gastroesophageal reflux disease.  Those symptoms are well controlled  on Aciphex 20 mg orally daily.   ALLERGIES.:  No known drug allergies.   PHYSICAL EXAMINATION:  GENERAL:  The patient appears to be in no acute  distress.  VITAL SIGNS:  Weight 173, height 5 feet 5 inches, temp 97.8, BP 108/70,  and pulse 80.  SKIN:  Warm and dry.  CHEST:  Lungs are clear to auscultation.  CARDIAC:  Regular rate and rhythm without murmur, gallop, or rub.  ABDOMEN:  Obese.  Positive bowel sounds.  Soft.  Tender right upper  quadrant.  No appreciable mass or organomegaly.  SKIN:  There is no  rash.   ASSESSMENT:  Epigastric right upper quadrant abdominal pain, etiology  not determined, symptoms ongoing.  The patient is frustrated.  She has  had multiple studies although she has not had a transabdominal  ultrasound.  Reflux symptoms well controlled on Aciphex.  At this point,  I feel like, we will have to go ahead and do a transabdominal ultrasound  of her gallbladder just to complete the GI evaluation here locally.  I  told the patient she may be best served by getting a second opinion with  the GI __________  over to [facility].  We will go ahead and start  the process of getting her in over there.  We will follow up on the  ultrasound as it becomes available.  Further recommendations to follow.       Jonathon Bellows, M.D.  Electronically Signed     RMR/MEDQ  D:  06/25/2008  T:  06/26/2008  Job:  [id number]

## 2011-04-20 NOTE — Op Note (Signed)
NAME:  Donna Ramirez, Donna Ramirez                ACCOUNT NO.:  [id number]   MEDICAL RECORD NO.:  [id number]          PATIENT TYPE:  AMB   LOCATION:  DAY                           FACILITY:  [facility]   PHYSICIAN:  R. Roetta Sessions, M.D. DATE OF BIRTH:  05/25/1971   DATE OF PROCEDURE:  05/01/2008  DATE OF DISCHARGE:                               OPERATIVE REPORT   Esophagogastroduodenoscopy with Elease Hashimoto dilation, followed by biopsy of  duodenum.   INDICATIONS FOR PROCEDURE:  A 40 year old lady with a 78-month history  of chronic nausea and dyspepsia.  A brief course of PPI therapy in the  way of OTC Prilosec has not really helped.  She has intermittent  esophageal dysphagia to solids.  She takes some Motrin on an  intermittent regular basis.  EGD is now being done.  Potential  esophageal dilation, biopsy, etc were reviewed with the patient.  Risks,  benefits, alternatives, and limitations have been reviewed and questions  were answered.  Please see the documentation in the medical record.   PROCEDURE NOTE:  O2 saturation, blood pressure, and pulse rate, and  respirations monitored throughout the entire procedure.   CONSCIOUS SEDATION:  Versed 4 mg IV and Demerol 100 g IV in divided  doses.  Cetacaine spray for topical pharyngeal anesthesia.   INSTRUMENT:  Pentax video chip system.   FINDINGS:  Examination of the tubular esophagus revealed noncritical  appearing Schatzki's ring and some tiny distal esophageal erosions  straddling the ring.  Otherwise, esophageal mucosa appeared  unremarkable.  EG junction easily traversed into his stomach.  Gastric  cavity was emptied, and insufflated well with air.  Thorough examination  of the gastric mucosa including retroflexed view of the proximal stomach  esophagogastric demonstrated only a small hiatal hernia.  Gastric mucosa  otherwise appeared entirely normal.  Pylorus was patent and easily  traversed.  Examination of the bulb, second, and third portion  revealed  normal appearing ampulla and medial wall of second portion of the  duodenum.  There was a 1.5 cm pedunculated mass in the distal second  portion of the duodenum.  Please see photos.  Overlying mucosa had a  little different contour appearance, and the duodenum mucosa, there did  appear to be possibly submucosa component.  This does not appear to be  an out-and-out adenoma or neoplasm.   THERAPEUTIC/DIAGNOSTIC MANEUVERS PERFORMED:  Scope was withdrawn.  A 56-  French Maloney dilator was in full insertion with ease, look back  revealed.  No apparent complications related to passage of the dilator  subsequently.  Biopsy of the duodenum mass were taken.  I got two good  bites of this lesion with some depth.  There was minimal bleeding.  The  patient tolerated the procedure well and was reactive in endoscopy.   IMPRESSION:  1. Distal esophageal erosions consistent with the mild reflux      esophagitis, noncritical appearing Schatzki's ring dilated as      described above, but otherwise, unremarkable esophagus, small      hiatal hernia, otherwise normal stomach and patent pylorus.  Normal  D1 and D2, and 1.5-cm pedunculated mass, distal D2 as described      above biopsy.   RECOMMENDATIONS:  1. Begin Aciphex 20 mg orally daily, literature on gastroesophageal      reflux disease providedto Ms. Lopp.  2. Follow up on biopsies.  3. The duodenum lesion may need further evaluation in the way of EUS      etc.      R. Roetta Sessions, M.D.  Electronically Signed     RMR/MEDQ  D:  05/01/2008  T:  05/01/2008  Job:  [id number]

## 2011-04-23 NOTE — Discharge Summary (Signed)
NAMECARRAH, EPPOLITO                ACCOUNT NO.:  [id number]   MEDICAL RECORD NO.:  [id number]          PATIENT TYPE:  INP   LOCATION:  A320                          FACILITY:  [facility]   PHYSICIAN:  Lazaro Arms, M.D.   DATE OF BIRTH:  1971-10-18   DATE OF ADMISSION:  10/18/2007  DATE OF DISCHARGE:  11/14/2008LH                               DISCHARGE SUMMARY   DISCHARGE DIAGNOSES:  1. Status post total abdominal hysterectomy and bilateral salpingo-      oophorectomy.  2. Unremarkable postoperative course.   PROCEDURE:  TAH-BSO.   Please refer to the history and physical as well as the operative report  details for admission to the hospital.   HOSPITAL COURSE:  The patient underwent a TAH BSO intraoperatively.  It  was without complications.  Her postoperative course was unremarkable.  She tolerated clear liquids and a regular diet.  She voided without  symptoms and was ambulatory.  She had progression with bowel function.  Her H&H postoperatively were 11.7 and 34.9.  On postoperative day #2,  they were 10.9 and 32.5.  Her abdominal exam was benign.  Her incision  was clean, dry and intact.  She was discharged to home on the morning of  post-op day #2 in good stable condition to follow up in the office next  week to have her incision evaluated.   She was given Percocet 5/325 for pain as well as Motrin.   She will be seen in the office next week for followup and given  instructions and precautions for return prior to that time.      Lazaro Arms, M.D.  Electronically Signed     LHE/MEDQ  D:  11/15/2007  T:  11/15/2007  Job:  [id number]

## 2011-05-14 ENCOUNTER — Encounter: Payer: Self-pay | Admitting: Internal Medicine

## 2011-05-27 ENCOUNTER — Other Ambulatory Visit (HOSPITAL_COMMUNITY): Payer: Self-pay | Admitting: Neurology

## 2011-05-27 DIAGNOSIS — Z139 Encounter for screening, unspecified: Secondary | ICD-10-CM

## 2011-07-06 ENCOUNTER — Other Ambulatory Visit (HOSPITAL_COMMUNITY): Payer: Self-pay | Admitting: Physical Medicine & Rehabilitation

## 2011-08-02 ENCOUNTER — Encounter: Payer: Self-pay | Admitting: *Deleted

## 2011-08-02 ENCOUNTER — Emergency Department (HOSPITAL_COMMUNITY)
Admission: EM | Admit: 2011-08-02 | Discharge: 2011-08-02 | Disposition: A | Discharge status: Home / Self Care | Payer: Medicaid Other | Attending: Emergency Medicine | Admitting: Emergency Medicine

## 2011-08-02 ENCOUNTER — Emergency Department (HOSPITAL_COMMUNITY): Payer: Medicaid Other

## 2011-08-02 DIAGNOSIS — Z87891 Personal history of nicotine dependence: Secondary | ICD-10-CM | POA: Insufficient documentation

## 2011-08-02 DIAGNOSIS — S93409A Sprain of unspecified ligament of unspecified ankle, initial encounter: Secondary | ICD-10-CM

## 2011-08-02 DIAGNOSIS — X500XXA Overexertion from strenuous movement or load, initial encounter: Secondary | ICD-10-CM | POA: Insufficient documentation

## 2011-08-02 DIAGNOSIS — Z87898 Personal history of other specified conditions: Secondary | ICD-10-CM | POA: Insufficient documentation

## 2011-08-02 NOTE — ED Provider Notes (Signed)
History     CSN: [id number] Arrival date & time: 08/02/2011  7:53 PM  Chief Complaint  Patient presents with  . Ankle Pain    right   Patient is a 40 y.o. female presenting with ankle pain. The history is provided by the patient.  Ankle Pain  The incident occurred more than 1 week ago. The incident occurred at home (She reports twisting her right ankle approximately one month ago ,  but never was evaluated for this injury.  She reports continued pain when she weight bears too much.). The pain is present in the right ankle. The pain is at a severity of 4/10. The pain is moderate. The pain has been intermittent since onset. Pertinent negatives include no numbness, no inability to bear weight, no loss of motion, no muscle weakness and no loss of sensation. She reports no foreign bodies present. The symptoms are aggravated by bearing weight and activity. She has tried nothing for the symptoms.    Past Medical History  Diagnosis Date  . Lymphoma     Past Surgical History  Procedure Date  . Abdominal hysterectomy   . Lymphadenectomy lymph nodes removed from bilateral underarms    History reviewed. No pertinent family history.  History  Substance Use Topics  . Smoking status: Former Games developer  . Smokeless tobacco: Not on file  . Alcohol Use: Yes     rarely    OB History    Grav Para Term Preterm Abortions TAB SAB Ect Mult Living                  Review of Systems  Neurological: Negative for numbness.  All other systems reviewed and are negative.    Physical Exam  BP 123/78  Pulse 102  Temp(Src) 98.8 F (37.1 C) (Oral)  Resp 20  Ht 5\' 5"  (1.651 m)  Wt 153 lb (69.4 kg)  BMI 25.46 kg/m2  SpO2 100%  Physical Exam  Nursing note and vitals reviewed. Constitutional: She is oriented to person, place, and time. She appears well-developed and well-nourished.  HENT:  Head: Normocephalic.  Eyes: Conjunctivae are normal.  Neck: Normal range of motion.  Cardiovascular: Normal  rate and intact distal pulses.  Exam reveals no decreased pulses.   Pulses:      Dorsalis pedis pulses are 2+ on the right side, and 2+ on the left side.       Posterior tibial pulses are 2+ on the right side, and 2+ on the left side.  Pulmonary/Chest: Effort normal.  Musculoskeletal: She exhibits edema and tenderness.       Right ankle: She exhibits decreased range of motion, swelling and ecchymosis. She exhibits normal pulse. tenderness. Lateral malleolus tenderness found. No AITFL, no CF ligament, no posterior TFL, no head of 5th metatarsal and no proximal fibula tenderness found. Achilles tendon normal.  Neurological: She is alert and oriented to person, place, and time. No sensory deficit.  Skin: Skin is warm, dry and intact.    ED Course  Procedures  MDM Partially healed sprain with recurent exacerbations.  Patients labs and/or radiological studies were reviewed during the medical decision making and disposition process.        Candis Musa, PA 08/02/11 2146

## 2011-08-02 NOTE — ED Notes (Signed)
Pt c/o right ankle pain x 1 month; pt states she was chasing her dog and twisted her ankle a month ago and is worried because she is still having pain to the ankle

## 2011-08-02 NOTE — ED Notes (Signed)
ASO splint applied to R ankle

## 2011-08-03 NOTE — ED Provider Notes (Signed)
Medical screening examination/treatment/procedure(s) were performed by non-physician practitioner and as supervising physician I was immediately available for consultation/collaboration. Luva Metzger, MD, FACEP  Mariane Burpee L Darel Ricketts, MD 08/03/11 0125 

## 2011-09-06 LAB — URINALYSIS, ROUTINE W REFLEX MICROSCOPIC
Nitrite: NEGATIVE
Specific Gravity, Urine: 1.02
Urobilinogen, UA: 0.2

## 2011-09-06 LAB — COMPREHENSIVE METABOLIC PANEL
ALT: 23
AST: 19
Alkaline Phosphatase: 91
CO2: 30
Calcium: 9.6
GFR calc Af Amer: >60
GFR calc non Af Amer: >60
Glucose, Bld: 97
Potassium: 4.4
Sodium: 136

## 2011-09-06 LAB — DIFFERENTIAL
Basophils Relative: 0
Eosinophils Absolute: 0.2
Eosinophils Relative: 4
Lymphs Abs: 2.2
Monocytes Relative: 7
Neutrophils Relative %: 58

## 2011-09-06 LAB — PREGNANCY, URINE: Preg Test, Ur: NEGATIVE

## 2011-09-06 LAB — CBC
MCV: 90.2
Platelets: 213
WBC: 6.9

## 2011-09-06 LAB — LIPASE, BLOOD: Lipase: 21

## 2011-09-14 LAB — URINE MICROSCOPIC-ADD ON

## 2011-09-14 LAB — DIFFERENTIAL
Basophils Absolute: 0
Basophils Absolute: 0
Basophils Absolute: 0
Basophils Relative: 0
Eosinophils Relative: 3
Eosinophils Relative: 4
Lymphocytes Relative: 27
Lymphocytes Relative: 31
Lymphs Abs: 1.7
Monocytes Absolute: 0.4
Monocytes Absolute: 0.7
Monocytes Relative: 6
Neutro Abs: 3.9
Neutro Abs: 4.6
Neutro Abs: 5.3

## 2011-09-14 LAB — CBC
HCT: 39.7
Hemoglobin: 10.9 — ABNORMAL LOW
Hemoglobin: 11.7 — ABNORMAL LOW
MCHC: 33.5
MCV: 90.9
Platelets: 179
Platelets: 193
Platelets: 245
RDW: 13.2
RDW: 13.3
RDW: 13.3
WBC: 6.3

## 2011-09-14 LAB — URINALYSIS, ROUTINE W REFLEX MICROSCOPIC
Glucose, UA: NEGATIVE
Specific Gravity, Urine: >1.03 — ABNORMAL HIGH
Urobilinogen, UA: 0.2
pH: 5.5

## 2011-09-14 LAB — COMPREHENSIVE METABOLIC PANEL
AST: 14
Albumin: 3.7
BUN: 14
Calcium: 9.2
Creatinine, Ser: 0.75
GFR calc Af Amer: >60
Total Protein: 6.1

## 2011-09-14 LAB — TYPE AND SCREEN: Antibody Screen: NEGATIVE

## 2011-09-22 ENCOUNTER — Ambulatory Visit (HOSPITAL_COMMUNITY)
Admission: RE | Admit: 2011-09-22 | Discharge: 2011-09-22 | Disposition: A | Discharge status: Home / Self Care | Payer: Medicaid Other | Source: Ambulatory Visit | Attending: Neurology | Admitting: Neurology

## 2011-09-22 ENCOUNTER — Ambulatory Visit (HOSPITAL_COMMUNITY)
Admission: RE | Admit: 2011-09-22 | Discharge: 2011-09-22 | Disposition: A | Discharge status: Home / Self Care | Payer: Medicaid Other | Source: Ambulatory Visit | Attending: Physical Medicine & Rehabilitation | Admitting: Physical Medicine & Rehabilitation

## 2011-09-22 ENCOUNTER — Other Ambulatory Visit (HOSPITAL_COMMUNITY): Payer: Self-pay | Admitting: Neurology

## 2011-09-22 ENCOUNTER — Other Ambulatory Visit (HOSPITAL_COMMUNITY): Payer: Self-pay | Admitting: Physical Medicine & Rehabilitation

## 2011-09-22 DIAGNOSIS — Z87898 Personal history of other specified conditions: Secondary | ICD-10-CM | POA: Insufficient documentation

## 2011-09-22 DIAGNOSIS — N63 Unspecified lump in unspecified breast: Secondary | ICD-10-CM | POA: Insufficient documentation

## 2011-09-22 DIAGNOSIS — Z923 Personal history of irradiation: Secondary | ICD-10-CM | POA: Insufficient documentation

## 2011-09-22 DIAGNOSIS — R52 Pain, unspecified: Secondary | ICD-10-CM

## 2011-09-22 DIAGNOSIS — M549 Dorsalgia, unspecified: Secondary | ICD-10-CM

## 2011-09-22 DIAGNOSIS — Z803 Family history of malignant neoplasm of breast: Secondary | ICD-10-CM | POA: Insufficient documentation

## 2011-11-15 ENCOUNTER — Emergency Department (HOSPITAL_COMMUNITY): Payer: Medicare Other

## 2011-11-15 ENCOUNTER — Emergency Department (HOSPITAL_COMMUNITY)
Admission: EM | Admit: 2011-11-15 | Discharge: 2011-11-15 | Disposition: A | Discharge status: Home / Self Care | Payer: Medicare Other | Attending: Emergency Medicine | Admitting: Emergency Medicine

## 2011-11-15 ENCOUNTER — Other Ambulatory Visit: Payer: Self-pay

## 2011-11-15 ENCOUNTER — Encounter (HOSPITAL_COMMUNITY): Payer: Self-pay | Admitting: Emergency Medicine

## 2011-11-15 DIAGNOSIS — F17200 Nicotine dependence, unspecified, uncomplicated: Secondary | ICD-10-CM | POA: Insufficient documentation

## 2011-11-15 DIAGNOSIS — M79621 Pain in right upper arm: Secondary | ICD-10-CM

## 2011-11-15 DIAGNOSIS — Z87898 Personal history of other specified conditions: Secondary | ICD-10-CM | POA: Insufficient documentation

## 2011-11-15 DIAGNOSIS — M25579 Pain in unspecified ankle and joints of unspecified foot: Secondary | ICD-10-CM

## 2011-11-15 DIAGNOSIS — M79609 Pain in unspecified limb: Secondary | ICD-10-CM | POA: Insufficient documentation

## 2011-11-15 DIAGNOSIS — R079 Chest pain, unspecified: Secondary | ICD-10-CM | POA: Insufficient documentation

## 2011-11-15 DIAGNOSIS — Z9079 Acquired absence of other genital organ(s): Secondary | ICD-10-CM | POA: Insufficient documentation

## 2011-11-15 LAB — CBC
HCT: 38.9 % (ref 36.0–46.0)
Hemoglobin: 13.2 g/dL (ref 12.0–15.0)
RBC: 4.2 MIL/uL (ref 3.87–5.11)
WBC: 2.9 10*3/uL — ABNORMAL LOW (ref 4.0–10.5)

## 2011-11-15 LAB — COMPREHENSIVE METABOLIC PANEL
AST: 14 U/L (ref 0–37)
BUN: 14 mg/dL (ref 6–23)
CO2: 28 mEq/L (ref 19–32)
Chloride: 106 mEq/L (ref 96–112)
Creatinine, Ser: 0.73 mg/dL (ref 0.50–1.10)
GFR calc non Af Amer: >90 mL/min (ref >90)
Total Bilirubin: 0.6 mg/dL (ref 0.3–1.2)

## 2011-11-15 LAB — DIFFERENTIAL
Basophils Absolute: 0 10*3/uL (ref 0.0–0.1)
Lymphocytes Relative: 43 % (ref 12–46)
Monocytes Absolute: 0.2 10*3/uL (ref 0.1–1.0)
Monocytes Relative: 7 % (ref 3–12)
Neutro Abs: 1.3 10*3/uL — ABNORMAL LOW (ref 1.7–7.7)

## 2011-11-15 MED ORDER — IOHEXOL 300 MG/ML  SOLN
80.0000 mL | Freq: Once | INTRAMUSCULAR | Status: AC | PRN
  Administered 2011-11-15: 80 mL via INTRAVENOUS

## 2011-11-15 MED ORDER — TRAMADOL HCL 50 MG PO TABS: 50.0000 mg | ORAL_TABLET | Freq: Four times a day (QID) | ORAL | Status: AC | PRN

## 2011-11-15 MED ORDER — NAPROXEN 500 MG PO TABS: 500.0000 mg | ORAL_TABLET | Freq: Two times a day (BID) | ORAL | Status: DC

## 2011-11-15 NOTE — ED Provider Notes (Signed)
History   This chart was scribed for Donna Lennert, MD by Sofie Rower. The patient was seen in room APA07/APA07 and the patient's care was started at 9:09AM.    CSN: [id number] Arrival date & time: 11/15/2011  9:00 AM   First MD Initiated Contact with Patient 11/15/11 [phone]      Chief Complaint  Patient presents with  . Chest Pain  . Foot Pain    (Consider location/radiation/quality/duration/timing/severity/associated sxs/prior treatment) HPI  Donna Ramirez is a 40 y.o. female who presents to the Emergency Department complaining of moderate, constant chest pain located in the right side onset last night. The pt reports that she has felt similar pain before but the pain has increased. Associated symptoms include swelling, sweating, loss of appetite.  Pt. Denies vomiting, diarrhea. The pt has a history of lymphoma. The patient also complains of pain located in the top of her right foot, onset two months ago.The patients PCP is Dr. Lanell Matar.   Past Medical History  Diagnosis Date  . Lymphoma     Past Surgical History  Procedure Date  . Abdominal hysterectomy   . Lymphadenectomy lymph nodes removed from bilateral underarms    Family History  Problem Relation Age of Onset  . Diabetes Mother   . Cancer Mother   . Hypertension Other   . Hyperlipidemia Other   . Cancer Other     History  Substance Use Topics  . Smoking status: Current Everyday Smoker -- 0.5 packs/day for 25 years    Types: Cigarettes  . Smokeless tobacco: Never Used  . Alcohol Use: Yes     rarely    OB History    Grav Para Term Preterm Abortions TAB SAB Ect Mult Living                  Review of Systems  10 Systems reviewed and are negative for acute change except as noted in the HPI.   Allergies  Review of patient's allergies indicates no known allergies.  Home Medications   Current Outpatient Rx  Name Route Sig Dispense Refill  . VIVELLE-DOT TD Transdermal Place 1 patch onto the skin 2  (two) times a week.      Marland Kitchen FIBER PO CAPS Oral Take 5 capsules by mouth daily as needed. laxative    . DIPHENHYDRAMINE HCL 25 MG PO CAPS Oral Take 25 mg by mouth at bedtime as needed. For sleep      . MELATONIN 5 MG PO TABS Oral Take 1 tablet by mouth at bedtime as needed. For sleep    . NAPROXEN 500 MG PO TABS Oral Take 1 tablet (500 mg total) by mouth 2 (two) times daily. 30 tablet 0  . TRAMADOL HCL 50 MG PO TABS Oral Take 50 mg by mouth every 6 (six) hours as needed. For pain    . TRAMADOL HCL 50 MG PO TABS Oral Take 1 tablet (50 mg total) by mouth every 6 (six) hours as needed for pain. Maximum dose= 8 tablets per day 15 tablet 0    BP 124/89  Pulse 81  Temp(Src) 97.6 F (36.4 C) (Oral)  Resp 16  Ht 5\' 5"  (1.651 m)  Wt 152 lb 6.4 oz (69.128 kg)  BMI 25.36 kg/m2  SpO2 98%  Physical Exam  Nursing note and vitals reviewed. Constitutional: She is oriented to person, place, and time. She appears well-developed and well-nourished.  HENT:  Head: Normocephalic and atraumatic.  Eyes: Conjunctivae and EOM are normal.  No scleral icterus.  Neck: Neck supple. No thyromegaly present.  Cardiovascular: Normal rate and regular rhythm.  Exam reveals no gallop and no friction rub.   No murmur heard. Pulmonary/Chest: No stridor. She has no wheezes. She has no rales. She exhibits no tenderness.  Abdominal: She exhibits no distension. There is no tenderness. There is no rebound.  Musculoskeletal: Normal range of motion. She exhibits no edema.       Tenderness in the right axilla. Normal right breast. Lateral right ankle tenderness. Pain with inversion of the ankle.  Lymphadenopathy:    She has no cervical adenopathy.  Neurological: She is oriented to person, place, and time. Coordination normal.  Skin: No rash noted. No erythema.  Psychiatric: She has a normal mood and affect. Her behavior is normal.    ED Course  Procedures (including critical care time)  DIAGNOSTIC STUDIES: Oxygen  Saturation is 98% on room air, normal by my interpretation.    COORDINATION OF CARE:  9:29AM-EDP at bedside discusses treatment plan. 12:32PM- Recheck. EDP discusses continued treatment plan.  Results for orders placed during the hospital encounter of 11/15/11  CBC      Component Value Range   WBC 2.9 (*) 4.0 - 10.5 (K/uL)   RBC 4.20  3.87 - 5.11 (MIL/uL)   Hemoglobin 13.2  12.0 - 15.0 (g/dL)   HCT 56.2  13.0 - 86.5 (%)   MCV 92.6  78.0 - 100.0 (fL)   MCH 31.4  26.0 - 34.0 (pg)   MCHC 33.9  30.0 - 36.0 (g/dL)   RDW 78.4  69.6 - 29.5 (%)   Platelets 119 (*) 150 - 400 (K/uL)  DIFFERENTIAL      Component Value Range   Neutrophils Relative 46  43 - 77 (%)   Neutro Abs 1.3 (*) 1.7 - 7.7 (K/uL)   Lymphocytes Relative 43  12 - 46 (%)   Lymphs Abs 1.2  0.7 - 4.0 (K/uL)   Monocytes Relative 7  3 - 12 (%)   Monocytes Absolute 0.2  0.1 - 1.0 (K/uL)   Eosinophils Relative 4  0 - 5 (%)   Eosinophils Absolute 0.1  0.0 - 0.7 (K/uL)   Basophils Relative 0  0 - 1 (%)   Basophils Absolute 0.0  0.0 - 0.1 (K/uL)  COMPREHENSIVE METABOLIC PANEL      Component Value Range   Sodium 141  135 - 145 (mEq/L)   Potassium 3.8  3.5 - 5.1 (mEq/L)   Chloride 106  96 - 112 (mEq/L)   CO2 28  19 - 32 (mEq/L)   Glucose, Bld 95  70 - 99 (mg/dL)   BUN 14  6 - 23 (mg/dL)   Creatinine, Ser 2.84  0.50 - 1.10 (mg/dL)   Calcium 9.8  8.4 - 13.2 (mg/dL)   Total Protein 6.8  6.0 - 8.3 (g/dL)   Albumin 3.9  3.5 - 5.2 (g/dL)   AST 14  0 - 37 (U/L)   ALT 11  0 - 35 (U/L)   Alkaline Phosphatase 79  39 - 117 (U/L)   Total Bilirubin 0.6  0.3 - 1.2 (mg/dL)   GFR calc non Af Amer >90  >90 (mL/min)   GFR calc Af Amer >90  >90 (mL/min)   Dg Ankle Complete Right  11/15/2011  *RADIOLOGY REPORT*  Clinical Data: Right ankle pain.  RIGHT ANKLE - COMPLETE 3+ VIEW  Comparison: None  Findings: The ankle mortise is maintained.  No acute ankle fracture or osteochondral lesion.  The mid and hind foot bony structures are visualized  appear normal also.  IMPRESSION: No acute bony findings.  Original Report Authenticated By: P. Loralie Champagne, M.D.   Ct Chest W Contrast  11/15/2011  *RADIOLOGY REPORT*  Clinical Data: Pain, history of lymphoma with chemo and radiation therapy status post hysterectomy  CT CHEST WITH CONTRAST  Technique:  Multidetector CT imaging of the chest was performed following the standard protocol during bolus administration of intravenous contrast.  Contrast: 80mL OMNIPAQUE IOHEXOL 300 MG/ML IV SOLN  Comparison: None.  Findings: Images of the thoracic inlet are unremarkable.  The sagittal images of the spine are unremarkable.  Sagittal view of the sternum is unremarkable.  There is no mediastinal adenopathy.  No hilar adenopathy.  There is a calcified lymph node in the right mediastinum adjacent to azygos vein not pathologic by size criteria.  Images of the lung parenchyma shows no acute infiltrate or pleural effusion.  No pulmonary edema.  Surgical clips are noted in the right axilla.  No adenopathy is noted.  The visualized upper abdomen is unremarkable.  No adrenal gland mass is noted.  Heart size is within normal limits.  No pericardial effusion is noted. Central pulmonary artery and thoracic aorta are unremarkable.  IMPRESSION:  1.  No acute infiltrate or pleural effusion.  No pulmonary edema. 2.  No adenopathy. 3. No evidence of aortic aneurysm.  Unremarkable central pulmonary artery.  Original Report Authenticated By: Natasha Mead, M.D.   Dg Foot Complete Right  11/15/2011  *RADIOLOGY REPORT*  Clinical Data: Right foot pain.  History of remote trauma.  RIGHT FOOT COMPLETE - 3+ VIEW  Comparison: None  Findings: The joint spaces are maintained.  No acute or healed fractures are identified.  No degenerative changes or abnormal soft tissue calcifications.  IMPRESSION: No acute bony findings or significant degenerative changes.  Original Report Authenticated By: P. Loralie Champagne, M.D.         MDM   The  chart was scribed for me under my direct supervision.  I personally performed the history, physical, and medical decision making and all procedures in the evaluation of this patient.Donna Lennert, MD 11/15/11 [phone]

## 2011-11-15 NOTE — ED Notes (Signed)
Patient c/o right sided chest pain that started yesterday. Per patient had cancer and had a port-a-cath in at one time on that side-patient states right side of chest swollen last night. Patient also c/o right foot pain. Patient reports twisting ankle 1-2 months ago. Patient states "I have this itch under my skin and a numbness feeling. I wore a boot that ya'll gave me. Now I have discoloration on the top of my foot."

## 2011-11-15 NOTE — ED Notes (Signed)
See EDP assessment, Edp was in room prior to nurse.

## 2012-04-12 ENCOUNTER — Encounter: Payer: Self-pay | Admitting: Internal Medicine

## 2012-04-13 ENCOUNTER — Ambulatory Visit (INDEPENDENT_AMBULATORY_CARE_PROVIDER_SITE_OTHER): Payer: Medicare Other | Admitting: Urgent Care

## 2012-04-13 ENCOUNTER — Encounter: Payer: Self-pay | Admitting: Urgent Care

## 2012-04-13 DIAGNOSIS — R1013 Epigastric pain: Secondary | ICD-10-CM

## 2012-04-13 DIAGNOSIS — Q391 Atresia of esophagus with tracheo-esophageal fistula: Secondary | ICD-10-CM

## 2012-04-13 DIAGNOSIS — R1310 Dysphagia, unspecified: Secondary | ICD-10-CM

## 2012-04-13 DIAGNOSIS — K222 Esophageal obstruction: Secondary | ICD-10-CM

## 2012-04-13 LAB — CBC WITH DIFFERENTIAL/PLATELET
Basophils Absolute: 0 10*3/uL (ref 0.0–0.1)
HCT: 43.3 % (ref 36.0–46.0)
Hemoglobin: 13.8 g/dL (ref 12.0–15.0)
Lymphocytes Relative: 40 % (ref 12–46)
Monocytes Absolute: 0.4 10*3/uL (ref 0.1–1.0)
Monocytes Relative: 10 % (ref 3–12)
Neutro Abs: 1.8 10*3/uL (ref 1.7–7.7)
RBC: 4.55 MIL/uL (ref 3.87–5.11)
WBC: 3.9 10*3/uL — ABNORMAL LOW (ref 4.0–10.5)

## 2012-04-13 LAB — HEPATIC FUNCTION PANEL
ALT: 12 U/L (ref 0–35)
AST: 16 U/L (ref 0–37)
Bilirubin, Direct: 0.1 mg/dL (ref 0.0–0.3)
Indirect Bilirubin: 0.2 mg/dL (ref 0.0–0.9)

## 2012-04-13 NOTE — Patient Instructions (Signed)
1-800-quit-now for help quitting smoking Go get your labs today and we will call you with results You will need a barium pill esophagram Referral to physical therapy for abdominal wall pain Begin omeprazole 20 mg daily

## 2012-04-13 NOTE — Progress Notes (Signed)
Results Cc to PCP  

## 2012-04-13 NOTE — Assessment & Plan Note (Signed)
Donna Ramirez is a pleasant 41 y.o. female with history of non-Hodgkin's lymphoma and chronic abdominal wall pain. She has recurrent symptoms not associated with eating, bowel habits, or movement. She denies typical GERD symptoms, however she does have known Schatzki's ring and is having some dysphagia. She may benefit from the addition of a PPI. We'll send referral for physical therapy for chronic abdominal wall pain. She can continue to take ultram for her pain as well.  Baseline CBC, LFTs, and lipase Physical therapy worked for abdominal wall pain Begin omeprazole 20 mg daily

## 2012-04-13 NOTE — Progress Notes (Signed)
Primary Care Physician:  Arsenio Katz, MD, MD Primary Gastroenterologist:  Dr. Jena Gauss  Chief Complaint  Patient presents with  . Abdominal Pain   HPI:  Donna Ramirez is a 41 y.o. female here for chronic upper abdominal pain. She has history of chronic epigastric and upper abdominal pain due to chronic abdominal wall pain. She was last seen by Dr. Jena Gauss December 2011. She has history of non-Hodgkin's lymphoma with extensive evaluation of her chronic abdominal wall pain both here and at [facility].   She has been having similar upper abdominal pain, mostly  epigastric/RUQ since her last visit in 2011.  It has been aching just like before.   The pain usually lasts all day. She says the Ultram she takes for her back pain seems to relieve the pain. She sees Dr. Gerilyn Pilgrim for this.   There is no change in the pain with movement, and it is not associated with eating or bowel habits.   She describes her BMs as normal, rare constipation w/ BM QOD,  especially when she eats cheese.  Rarely takes Mag citrate q 2-3 mo.  Denies rectal bleeding or melena.   Denies heartburn or indigestion.   Occasionally food Feels like it sits in her mid-esophagus & won't go down.  No problems with liquids.  History of Schatzki's ring on previous EGDs. C/o nausea without vomiting.  Takes OTC motion control pills for this.  Denies odynophagia or anorexia.   She has been eating healthier.  Remission 2012 for her lymphoma.  Denies fever or chills.   Weight has been stable.   Past Medical History  Diagnosis Date  . Non Hodgkin's lymphoma 2011  . DDD (degenerative disc disease)   . Abdominal wall pain   . Hearing loss   . Fatty liver   . Fibroid, uterine   . Marijuana abuse   . Chronic back pain    Past Surgical History  Procedure Date  . Abdominal hysterectomy 2008    complete  . Lymphadenectomy 2011  . Esophagogastroduodenoscopy 08/19/10    Rourk-noncritical schatzki ring, abnormal and pillow   . Colonoscopy 07/31/10   Rourk-hyperplastic polyp  . Esophagogastroduodenoscopy 04/2008    Rourk-mild erosive reflux esophagitis, noncritical Schatzki's ring status post dilation, 1.5 cm pedunculated mass distal D2  . Eus 6/09    Jacobs-6-8 MM nodule seen with recent mucosal biopsy 2-3 cm distal to the major papilla, biopsy benign, 1 cm or adenomatous-appearing mucus at the major papilla biopsy (focal mild inflammation associated with slight atypia)  . Esophagogastroduodenoscopy 10/09    [facility]-normal    Current Outpatient Prescriptions  Medication Sig Dispense Refill  . Estradiol (VIVELLE-DOT TD) Place 1 patch onto the skin 2 (two) times a week.        . Multiple Vitamin (MULTIVITAMIN) capsule Take 1 capsule by mouth daily.      . traMADol (ULTRAM) 50 MG tablet Take 50 mg by mouth every 6 (six) hours as needed. For pain        Allergies as of 04/13/2012  . (No Known Allergies)    Family History:There is no known family history of colorectal carcinoma , liver disease, or inflammatory bowel disease.  Problem Relation Age of Onset  . Diabetes Mother   . Breast cancer Mother   . Hypertension Other   . Hyperlipidemia Other   . Cancer Other   . Colon polyps Father 47    History   Social History  . Marital Status: Single  Spouse Name: N/A    Number of Children: 0  . Years of Education: N/A   Occupational History  . disabled    Social History Main Topics  . Smoking status: Current Some Day Smoker -- 0.2 packs/day for 25 years    Types: Cigarettes  . Smokeless tobacco: Never Used  . Alcohol Use: Yes     rarely couple times per yr  . Drug Use: Yes    Special: Marijuana     no marijuana in 4-5 mo  . Sexually Active: No  Review of Systems: Gen: Denies any fever, chills, sweats, anorexia, fatigue, weakness, malaise, weight loss, and sleep disorder CV: Denies chest pain, angina, palpitations, syncope, orthopnea, PND, peripheral edema, and claudication. Resp: Denies dyspnea at rest, dyspnea with  exercise, cough, sputum, wheezing, coughing up blood, and pleurisy. GI: Denies vomiting blood, jaundice, and fecal incontinence.  GU : Denies urinary burning, blood in urine, urinary frequency, urinary hesitancy, nocturnal urination, and urinary incontinence. MS: Denies joint pain, limitation of movement, and swelling, stiffness, low back pain, extremity pain. Denies muscle weakness, cramps, atrophy.  Derm: Denies rash, itching, dry skin, hives, moles, warts, or unhealing ulcers.  Psych: Denies depression, anxiety, memory loss, suicidal ideation, hallucinations, paranoia, and confusion. Heme: Denies bruising, bleeding, and enlarged lymph nodes. Neuro:  Denies any headaches, dizziness, paresthesias. Endo:  Denies any problems with DM, thyroid, adrenal function.  Physical Exam: BP 106/71  Pulse 79  Temp(Src) 98.4 F (36.9 C) (Temporal)  Ht 5\' 5"  (1.651 m)  Wt 162 lb (73.483 kg)  BMI 26.96 kg/m2 General:   Alert,  Well-developed, well-nourished, pleasant and cooperative in NAD. Head:  Normocephalic and atraumatic. Eyes:  Sclera clear, no icterus.   Conjunctiva pink. Ears:  Normal auditory acuity. Nose:  No deformity, discharge, or lesions. Mouth:  No deformity or lesions,oropharynx pink & moist. Neck:  Supple; no masses or thyromegaly. Lungs:  Clear throughout to auscultation.   No wheezes, crackles, or rhonchi. No acute distress. Heart:  Regular rate and rhythm; no murmurs, clicks, rubs,  or gallops. Abdomen:  Normal bowel sounds.  No bruits.  Soft, with mild tenderness to epigastrium and right upper quadrant on palpation. + Carnett sign.  No hepatosplenomegaly or hernias noted.  No guarding or rebound tenderness.   Rectal:  Deferred. Msk:  Symmetrical without gross deformities. Normal posture. Pulses:  Normal pulses noted. Extremities:  No clubbing or edema. Neurologic:  Alert and  oriented x4;  grossly normal neurologically. Skin:  Intact without significant lesions or rashes. Lymph  Nodes:  No significant cervical adenopathy. Psych:  Alert and cooperative. Normal mood and affect.

## 2012-04-13 NOTE — Assessment & Plan Note (Addendum)
Recurrent dysphagia in the setting of known Schatzki's ring. Begin PPI.  BPE, may need EGD with esophageal dilation in near future. 1-800-quit-now for help quitting smoking

## 2012-05-02 NOTE — Progress Notes (Signed)
Quick Note:  Did patient get BPE? ______

## 2012-05-03 NOTE — Progress Notes (Signed)
Quick Note:  Okay. Complete the patient no her lab work is okay. White blood cell count is just minimally below normal, but much improved since 5 months ago. She should followup with her primary care provider for this. CC: Ashkin, Gerome Apley, MD  ______

## 2012-05-04 NOTE — Progress Notes (Signed)
Results Cc to PCP  

## 2012-05-04 NOTE — Progress Notes (Signed)
Quick Note:  Pt aware. Please cc pcp ______

## 2012-05-11 ENCOUNTER — Ambulatory Visit (HOSPITAL_COMMUNITY)
Admission: RE | Admit: 2012-05-11 | Discharge: 2012-05-11 | Disposition: A | Discharge status: Home / Self Care | Payer: Medicare Other | Source: Ambulatory Visit | Attending: Urgent Care | Admitting: Urgent Care

## 2012-05-11 DIAGNOSIS — R1310 Dysphagia, unspecified: Secondary | ICD-10-CM | POA: Insufficient documentation

## 2012-05-11 DIAGNOSIS — R1013 Epigastric pain: Secondary | ICD-10-CM | POA: Insufficient documentation

## 2012-05-11 NOTE — Progress Notes (Signed)
Quick Note:  Please let pt know, x ray normal. No problems with esophagus stricture or swallowing. Continue w/ current plan & needs FU OV 3 mo ZO:[id number] abd pain Thanks NW:[id number], Gerome Apley, MD  ______

## 2012-07-12 ENCOUNTER — Ambulatory Visit: Payer: Medicare Other | Attending: Neurology | Admitting: Sleep Medicine

## 2012-07-12 DIAGNOSIS — G4733 Obstructive sleep apnea (adult) (pediatric): Secondary | ICD-10-CM

## 2012-07-12 DIAGNOSIS — G4710 Hypersomnia, unspecified: Secondary | ICD-10-CM | POA: Insufficient documentation

## 2012-07-18 NOTE — Procedures (Signed)
Donna Ramirez, Donna Ramirez                ACCOUNT NO.:  [id number]  MEDICAL RECORD NO.:  [id number]          PATIENT TYPE:  OUT  LOCATION:  SLEEP LAB                     FACILITY:  [facility]  PHYSICIAN:  Aaliyah Cancro A. Gerilyn Pilgrim, M.D. DATE OF BIRTH:  September 29, 1971  DATE OF STUDY:  07/12/2012                           NOCTURNAL POLYSOMNOGRAM  REFERRING PHYSICIAN:  EVAN A ASHKIN  INDICATION:  A 41 year old lady who presents with snoring, difficulty falling asleep, and fatigue.  MEDICATIONS:  Tramadol, Advil p.m. and other over-the-counter sleep aid.  EPWORTH SLEEPINESS SCALE:  3.  BMI:  27.  ARCHITECTURAL SUMMARY:  The total recording time is 383 minutes, sleep efficiency 76%, sleep latency 16 minutes, REM latency 1.5 minutes. Stage N1 10%, N2 79%, N3 6%, and REM sleep 5%  RESPIRATORY SUMMARY:  Baseline oxygen saturation is 99, lowest saturation 93.  Diagnostic AHI 0 and RDI 0.  LIMB MOVEMENT SUMMARY:  PLM index 0.  ELECTROCARDIOGRAM SUMMARY:  Average heart rate is 60 with no significant dysrhythmias observed.  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    Tracina Beaumont A. Gerilyn Pilgrim, M.D.    KAD/MEDQ  D:  07/18/2012 09:19:40  T:  07/18/2012 09:54:51  Job:  [id number]

## 2012-09-05 ENCOUNTER — Other Ambulatory Visit (HOSPITAL_COMMUNITY): Payer: Self-pay | Admitting: Family Medicine

## 2012-09-05 DIAGNOSIS — IMO0001 Reserved for inherently not codable concepts without codable children: Secondary | ICD-10-CM

## 2012-09-18 ENCOUNTER — Ambulatory Visit (HOSPITAL_COMMUNITY)
Admission: RE | Admit: 2012-09-18 | Discharge: 2012-09-18 | Disposition: A | Discharge status: Home / Self Care | Payer: Medicare Other | Source: Ambulatory Visit | Attending: Family Medicine | Admitting: Family Medicine

## 2012-09-18 DIAGNOSIS — IMO0001 Reserved for inherently not codable concepts without codable children: Secondary | ICD-10-CM

## 2012-09-18 DIAGNOSIS — Z1231 Encounter for screening mammogram for malignant neoplasm of breast: Secondary | ICD-10-CM | POA: Insufficient documentation

## 2013-05-19 ENCOUNTER — Encounter (HOSPITAL_COMMUNITY): Payer: Self-pay | Admitting: Emergency Medicine

## 2013-05-19 ENCOUNTER — Emergency Department (HOSPITAL_COMMUNITY)
Admission: EM | Admit: 2013-05-19 | Discharge: 2013-05-19 | Disposition: A | Discharge status: Home / Self Care | Payer: Medicare Other | Attending: Emergency Medicine | Admitting: Emergency Medicine

## 2013-05-19 DIAGNOSIS — Z8739 Personal history of other diseases of the musculoskeletal system and connective tissue: Secondary | ICD-10-CM | POA: Insufficient documentation

## 2013-05-19 DIAGNOSIS — Z8742 Personal history of other diseases of the female genital tract: Secondary | ICD-10-CM | POA: Insufficient documentation

## 2013-05-19 DIAGNOSIS — F17200 Nicotine dependence, unspecified, uncomplicated: Secondary | ICD-10-CM | POA: Insufficient documentation

## 2013-05-19 DIAGNOSIS — J029 Acute pharyngitis, unspecified: Secondary | ICD-10-CM | POA: Insufficient documentation

## 2013-05-19 DIAGNOSIS — Z87898 Personal history of other specified conditions: Secondary | ICD-10-CM | POA: Insufficient documentation

## 2013-05-19 DIAGNOSIS — G8929 Other chronic pain: Secondary | ICD-10-CM | POA: Insufficient documentation

## 2013-05-19 DIAGNOSIS — M549 Dorsalgia, unspecified: Secondary | ICD-10-CM | POA: Insufficient documentation

## 2013-05-19 LAB — RAPID STREP SCREEN (MED CTR MEBANE ONLY): Streptococcus, Group A Screen (Direct): NEGATIVE

## 2013-05-19 MED ORDER — LIDOCAINE VISCOUS 2 % MT SOLN
15.0000 mL | Freq: Once | OROMUCOSAL | Status: AC
  Administered 2013-05-19: 15 mL via OROMUCOSAL
  Filled 2013-05-19: qty 15

## 2013-05-19 MED ORDER — LIDOCAINE VISCOUS 2 % MT SOLN: 15.0000 mL | OROMUCOSAL | Status: DC | PRN

## 2013-05-19 MED ORDER — IBUPROFEN 600 MG PO TABS: 600.0000 mg | ORAL_TABLET | Freq: Four times a day (QID) | ORAL | Status: DC | PRN

## 2013-05-19 NOTE — ED Notes (Signed)
Pt c/o sore throat x1 week. Pt denies cough, fever, congestion.

## 2013-05-21 LAB — CULTURE, GROUP A STREP

## 2013-05-21 NOTE — ED Provider Notes (Signed)
History     CSN: [id number]  Arrival date & time 05/19/13  Donna Ramirez   First MD Initiated Contact with Patient 05/19/13 1909      Chief Complaint  Patient presents with  . Sore Throat    (Consider location/radiation/quality/duration/timing/severity/associated sxs/prior treatment) HPI Comments: Donna Ramirez is a 42 y.o. Female presenting with a one-week history of sore throat which is  constant  but  worsened when swallowing. she denies weakness, fevers, cough, nasal congestion, shortness of breath, ear pain and has had no recent dental problems.  She has tried no medications for alleviation of worsened symptoms.  She does have a history significant for non-Hodgkin's lymphoma which is followed at [facility], and has been in remission since 2012 and does get regular 6 month followups with her provider at [facility].  Additionally she has a history of Schatzki's ring, she denies currently having any problems with heartburn, acid reflux or difficulty swallowing her food.    The history is provided by the patient.    Past Medical History  Diagnosis Date  . Non Hodgkin's lymphoma 2011  . DDD (degenerative disc disease)   . Abdominal wall pain   . Hearing loss   . Fatty liver   . Fibroid, uterine   . Marijuana abuse   . Chronic back pain     Past Surgical History  Procedure Laterality Date  . Abdominal hysterectomy  2008    complete  . Lymphadenectomy  2011  . Esophagogastroduodenoscopy  08/19/10    Rourk-noncritical schatzki ring, abnormal and pillow   . Colonoscopy  07/31/10    Rourk-hyperplastic polyp  . Esophagogastroduodenoscopy  04/2008    Rourk-mild erosive reflux esophagitis, noncritical Schatzki's ring status post dilation, 1.5 cm pedunculated mass distal D2  . Eus  6/09    Jacobs-6-8 MM nodule seen with recent mucosal biopsy 2-3 cm distal to the major papilla, biopsy benign, 1 cm or adenomatous-appearing mucus at the major papilla biopsy (focal mild inflammation associated with slight  atypia)  . Esophagogastroduodenoscopy  10/09    [facility]-normal    Family History  Problem Relation Age of Onset  . Diabetes Mother   . Breast cancer Mother   . Hypertension Other   . Hyperlipidemia Other   . Cancer Other   . Colon polyps Father 58    History  Substance Use Topics  . Smoking status: Current Some Day Smoker -- 0.20 packs/day for 25 years    Types: Cigarettes  . Smokeless tobacco: Never Used  . Alcohol Use: Yes     Comment: rarely couple times per yr    OB History   Grav Para Term Preterm Abortions TAB SAB Ect Mult Living                  Review of Systems  Constitutional: Negative for fever.  HENT: Positive for sore throat. Negative for congestion, rhinorrhea, trouble swallowing, neck pain, neck stiffness and voice change.   Eyes: Negative.   Respiratory: Negative for chest tightness and shortness of breath.   Cardiovascular: Negative for chest pain.  Gastrointestinal: Negative for nausea and abdominal pain.  Genitourinary: Negative.   Musculoskeletal: Negative for joint swelling and arthralgias.  Skin: Negative.  Negative for rash and wound.  Neurological: Negative for dizziness, weakness, light-headedness, numbness and headaches.  Psychiatric/Behavioral: Negative.     Allergies  Review of patient's allergies indicates no known allergies.  Home Medications   Current Outpatient Rx  Name  Route  Sig  Dispense  Refill  . acetaminophen (KLS RAPID RELEASE PAIN) 500 MG tablet   Oral   Take 500 mg by mouth every 6 (six) hours as needed for pain.         . Estradiol (VIVELLE-DOT TD)   Transdermal   Place 1 patch onto the skin 2 (two) times a week. Applies on Mondays and Thursdays         . Multiple Vitamin (MULTIVITAMIN) capsule   Oral   Take 1 capsule by mouth daily.         . traMADol (ULTRAM) 50 MG tablet   Oral   Take 50 mg by mouth every 6 (six) hours as needed. For pain         . ibuprofen (ADVIL,MOTRIN) 600 MG tablet   Oral    Take 1 tablet (600 mg total) by mouth every 6 (six) hours as needed for pain.   20 tablet   0   . lidocaine (XYLOCAINE) 2 % solution   Oral   Take 15 mLs by mouth as needed for pain.   100 mL   0   . lidocaine (XYLOCAINE) 2 % solution   Oral   Take 15 mLs by mouth as needed for pain.   100 mL   0     BP 95/77  Temp(Src) 98.4 F (36.9 C)  Resp 18  SpO2 100%  Physical Exam  Nursing note and vitals reviewed. Constitutional: She appears well-developed and well-nourished.  HENT:  Head: Normocephalic and atraumatic.  Right Ear: Tympanic membrane and external ear normal.  Left Ear: Tympanic membrane and ear canal normal.  Nose: No mucosal edema or rhinorrhea.  Mouth/Throat: Uvula is midline. Normal dentition. Posterior oropharyngeal erythema present. No oropharyngeal exudate, posterior oropharyngeal edema or tonsillar abscesses.  Mild erythema posterior pharynx  Eyes: Conjunctivae are normal.  Neck: Normal range of motion.  Cardiovascular: Normal rate, regular rhythm, normal heart sounds and intact distal pulses.   Pulmonary/Chest: Effort normal and breath sounds normal. She has no wheezes.  Abdominal: Soft. Bowel sounds are normal. There is no tenderness.  Musculoskeletal: Normal range of motion.  Neurological: She is alert.  Skin: Skin is warm and dry.  Psychiatric: She has a normal mood and affect.    ED Course  Procedures (including critical care time)  Labs Reviewed  RAPID STREP SCREEN  CULTURE, GROUP A STREP   No results found.   1. Sore throat       MDM  Labs were reviewed with the patient.  This is probably a simple viral pharyngitis.  She was encouraged ibuprofen, also prescribed viscous lidocaine when necessary throat pain.  Rest, increase fluid intake.  Followup with PCP for recheck if not improving, and her specialist at [facility] as scheduled.      Burgess Amor, PA-C 05/21/13 2112

## 2013-05-22 NOTE — ED Provider Notes (Signed)
Medical screening examination/treatment/procedure(s) were performed by non-physician practitioner and as supervising physician I was immediately available for consultation/collaboration.  Juliet Rude. Rubin Payor, MD 05/22/13 [id number]

## 2013-09-07 ENCOUNTER — Other Ambulatory Visit (HOSPITAL_COMMUNITY): Payer: Self-pay | Admitting: Family Medicine

## 2013-09-07 DIAGNOSIS — Z139 Encounter for screening, unspecified: Secondary | ICD-10-CM

## 2013-09-20 ENCOUNTER — Ambulatory Visit (HOSPITAL_COMMUNITY)
Admission: RE | Admit: 2013-09-20 | Discharge: 2013-09-20 | Disposition: A | Discharge status: Home / Self Care | Payer: Medicare Other | Source: Ambulatory Visit | Attending: Family Medicine | Admitting: Family Medicine

## 2013-09-20 DIAGNOSIS — Z139 Encounter for screening, unspecified: Secondary | ICD-10-CM

## 2013-09-20 DIAGNOSIS — Z1231 Encounter for screening mammogram for malignant neoplasm of breast: Secondary | ICD-10-CM | POA: Insufficient documentation

## 2014-09-16 ENCOUNTER — Other Ambulatory Visit (HOSPITAL_COMMUNITY): Payer: Self-pay | Admitting: Family Medicine

## 2014-09-16 DIAGNOSIS — Z1231 Encounter for screening mammogram for malignant neoplasm of breast: Secondary | ICD-10-CM

## 2014-09-30 ENCOUNTER — Ambulatory Visit (HOSPITAL_COMMUNITY)
Admission: RE | Admit: 2014-09-30 | Discharge: 2014-09-30 | Disposition: A | Discharge status: Home / Self Care | Payer: Medicare Other | Source: Ambulatory Visit | Attending: Family Medicine | Admitting: Family Medicine

## 2014-09-30 DIAGNOSIS — Z1231 Encounter for screening mammogram for malignant neoplasm of breast: Secondary | ICD-10-CM | POA: Diagnosis not present

## 2014-10-04 ENCOUNTER — Ambulatory Visit (HOSPITAL_COMMUNITY): Payer: Medicare Other

## 2015-05-21 ENCOUNTER — Ambulatory Visit (HOSPITAL_COMMUNITY): Payer: Medicare Other | Attending: Family Medicine | Admitting: Physical Therapy

## 2015-07-08 ENCOUNTER — Encounter: Payer: Self-pay | Admitting: Internal Medicine

## 2015-10-03 ENCOUNTER — Other Ambulatory Visit (HOSPITAL_COMMUNITY): Payer: Self-pay | Admitting: Family Medicine

## 2015-10-03 DIAGNOSIS — Z1231 Encounter for screening mammogram for malignant neoplasm of breast: Secondary | ICD-10-CM

## 2015-10-20 ENCOUNTER — Ambulatory Visit (HOSPITAL_COMMUNITY)
Admission: RE | Admit: 2015-10-20 | Discharge: 2015-10-20 | Disposition: A | Discharge status: Home / Self Care | Payer: Medicare Other | Source: Ambulatory Visit | Attending: Family Medicine | Admitting: Family Medicine

## 2015-10-20 ENCOUNTER — Other Ambulatory Visit (HOSPITAL_COMMUNITY): Payer: Self-pay | Admitting: Family Medicine

## 2015-10-20 ENCOUNTER — Inpatient Hospital Stay (HOSPITAL_COMMUNITY): Admission: RE | Admit: 2015-10-20 | Payer: Medicare Other | Source: Ambulatory Visit

## 2015-10-20 DIAGNOSIS — Z1231 Encounter for screening mammogram for malignant neoplasm of breast: Secondary | ICD-10-CM

## 2016-01-08 ENCOUNTER — Ambulatory Visit (INDEPENDENT_AMBULATORY_CARE_PROVIDER_SITE_OTHER): Payer: Medicare Other | Admitting: Orthopaedic Surgery

## 2016-01-08 ENCOUNTER — Encounter: Payer: Self-pay | Admitting: Orthopaedic Surgery

## 2016-01-08 VITALS — BP 124/92 | HR 93 | Temp 97.5°F | Ht 65.0 in | Wt 193.0 lb

## 2016-01-08 DIAGNOSIS — M545 Low back pain, unspecified: Secondary | ICD-10-CM

## 2016-01-08 MED ORDER — HYDROCODONE-ACETAMINOPHEN 5-325 MG PO TABS: 1.0000 | ORAL_TABLET | ORAL | Status: DC | PRN

## 2016-01-08 NOTE — Patient Instructions (Signed)
Stop taking Tramadol, New prescription given

## 2016-01-09 DIAGNOSIS — M545 Low back pain, unspecified: Secondary | ICD-10-CM | POA: Insufficient documentation

## 2016-01-09 NOTE — Progress Notes (Addendum)
Patient Donna Ramirez, female DOB:[birth year], 45 y.o. [id number]  Chief Complaint  Patient presents with  . Hip Pain    HPI  Donna Ramirez is a 45 y.o. female who has chronic lower back pain and left hip pain. She has no radiation, no paresthesias, no new trauma, no redness.  She is taking her medicine and doing her exercises.   Hip Pain  The pain is present in the left hip. The quality of the pain is described as aching. The pain is at a severity of 2/10. The pain is mild. The pain has been fluctuating since onset. She has tried acetaminophen, heat, ice and NSAIDs for the symptoms. The treatment provided moderate relief.  Back Pain This is a chronic problem. The current episode started more than 1 year ago. The problem occurs every several days. The problem has been waxing and waning since onset. The pain is present in the lumbar spine. The quality of the pain is described as aching and burning. The pain does not radiate. The pain is at a severity of 2/10. The pain is mild. The pain is worse during the day. The symptoms are aggravated by bending and twisting. Pertinent negatives include no chest pain.    Review of Systems Review of Systems  HENT: Positive for hearing loss. Negative for congestion.   Eyes: Negative.   Cardiovascular: Negative for chest pain.  Gastrointestinal:       Fatty Liver  Endocrine:       Non Hodgkins Lymphoma  Musculoskeletal: Positive for myalgias and back pain.    Past Medical History  Diagnosis Date  . Non Hodgkin's lymphoma ([place]) 2011  . DDD (degenerative disc disease)   . Abdominal wall pain   . Hearing loss   . Fatty liver   . Fibroid, uterine   . Marijuana abuse   . Chronic back pain     Past Surgical History  Procedure Laterality Date  . Abdominal hysterectomy  2008    complete  . Lymphadenectomy  2011  . Esophagogastroduodenoscopy  08/19/10    Rourk-noncritical schatzki ring, abnormal and pillow   . Colonoscopy  07/31/10     Rourk-hyperplastic polyp  . Esophagogastroduodenoscopy  04/2008    Rourk-mild erosive reflux esophagitis, noncritical Schatzki's ring status post dilation, 1.5 cm pedunculated mass distal D2  . Eus  6/09    Jacobs-6-8 MM nodule seen with recent mucosal biopsy 2-3 cm distal to the major papilla, biopsy benign, 1 cm or adenomatous-appearing mucus at the major papilla biopsy (focal mild inflammation associated with slight atypia)  . Esophagogastroduodenoscopy  10/09    [facility]-normal    Family History  Problem Relation Age of Onset  . Diabetes Mother   . Breast cancer Mother   . Hypertension Other   . Hyperlipidemia Other   . Cancer Other   . Colon polyps Father 56    Social History Social History  Substance Use Topics  . Smoking status: Current Some Day Smoker -- 0.20 packs/day for 25 years    Types: Cigarettes  . Smokeless tobacco: Never Used  . Alcohol Use: Yes     Comment: rarely couple times per yr    No Known Allergies  Current Outpatient Prescriptions  Medication Sig Dispense Refill  . diclofenac (VOLTAREN) 75 MG EC tablet Take 75 mg by mouth 2 (two) times daily.    Marland Kitchen PARoxetine (PAXIL) 40 MG tablet Take 40 mg by mouth every morning.    . traMADol (ULTRAM) 50 MG tablet  Take 50 mg by mouth every 6 (six) hours as needed. For pain    . acetaminophen (KLS RAPID RELEASE PAIN) 500 MG tablet Take 500 mg by mouth every 6 (six) hours as needed for pain. Reported on 01/08/2016    . Estradiol (VIVELLE-DOT TD) Place 1 patch onto the skin 2 (two) times a week. Reported on 01/08/2016    . HYDROcodone-acetaminophen (NORCO/VICODIN) 5-325 MG tablet Take 1 tablet by mouth every 4 (four) hours as needed for moderate pain (Must last 30 days.  Do not take and drive a car or use machinery.). 120 tablet 0  . ibuprofen (ADVIL,MOTRIN) 600 MG tablet Take 1 tablet (600 mg total) by mouth every 6 (six) hours as needed for pain. (Patient not taking: Reported on 01/08/2016) 20 tablet 0  . lidocaine (XYLOCAINE) 2  % solution Take 15 mLs by mouth as needed for pain. (Patient not taking: Reported on 01/08/2016) 100 mL 0  . lidocaine (XYLOCAINE) 2 % solution Take 15 mLs by mouth as needed for pain. (Patient not taking: Reported on 01/08/2016) 100 mL 0  . Multiple Vitamin (MULTIVITAMIN) capsule Take 1 capsule by mouth daily. Reported on 01/08/2016     No current facility-administered medications for this visit.     Physical Exam  Blood pressure 124/92, pulse 93, temperature 97.5 F (36.4 C), height 5\' 5"  (1.651 m), weight 193 lb (87.544 kg).  Constitutional: overall normal hygiene, normal nutrition, well developed, normal grooming, normal body habitus. Assistive device:none   Musculoskeletal: gait and station Limp to left slightly, muscle tone and strength are normal, no tremors or atrophy is present.  .  Neurological: coordination overall normal.  Deep tendon reflex/nerve stretch intact.  Sensation normal.  Cranial nerves II-XII intact.   Skin: overall no scars, lesions, ulcers or rash es. No psoriasis.  Psychiatric: Alert and oriented x 3.  Recent memory intact, remote memory unclear.  Normal mood and affect. Well groomed.  Good eye contact.  Cardiovascular: overall no swelling, no varicosities, no edema bilaterally, normal temperatures of the legs and arms, no clubbing, cyanosis and good capillary refill.  Lymphatic: palpation is normal.    Extremities:Left hip is slightly tender laterally but has no redness.  The lower back is slightly tender but has no spasm, no color changes.  SI joint benign Inspection slight diffuse tenderness of the mid lower back Strength and tone normal Range of motion normal of the lower back and of the left hip.   PLAN Call if any problems.  Precautions discussed.  Continue current medications. I again went over back exercises with her and for her to continue them.  I will have her stop the Tramadol as it does not help and change to Hydrocodone.  Return to clinic one  month.

## 2016-02-05 ENCOUNTER — Ambulatory Visit (INDEPENDENT_AMBULATORY_CARE_PROVIDER_SITE_OTHER): Payer: Medicare Other | Admitting: Orthopaedic Surgery

## 2016-02-05 VITALS — BP 136/85 | HR 102 | Temp 97.2°F | Ht 65.0 in | Wt 194.6 lb

## 2016-02-05 DIAGNOSIS — M545 Low back pain, unspecified: Secondary | ICD-10-CM

## 2016-02-05 NOTE — Progress Notes (Signed)
Patient Donna Ramirez:[id number] L Mangini, female DOB:11-03-1971, 45 y.o. BN:[id number]  Chief Complaint  Patient presents with  . Hip Pain    Left hip pain    HPI  Donna Ramirez is a 45 y.o. female who has lower back pain. She is improved.  She has no paresthesias.  Her pain is much less.  She is doing her exercises.  She has decided to lose weight and stop smoking.  [facility].  She has no bowel or bladder problems.  HPI  Body mass index is 32.38 kg/(m^2).   Review of Systems  Constitutional:       Patient does not have Diabetes Mellitus. Patient does not have hypertension. Patient does not have COPD or shortness of breath. Patient does not have BMI > 35. Patient has current smoking history.  Gastrointestinal:       Fatty Liver  Endocrine:       Non Hodgkins Lymphoma  Musculoskeletal: Positive for back pain.    Past Medical History  Diagnosis Date  . Non Hodgkin's lymphoma ([place]) 2011  . DDD (degenerative disc disease)   . Abdominal wall pain   . Hearing loss   . Fatty liver   . Fibroid, uterine   . Marijuana abuse   . Chronic back pain     Past Surgical History  Procedure Laterality Date  . Abdominal hysterectomy  2008    complete  . Lymphadenectomy  2011  . Esophagogastroduodenoscopy  08/19/10    Rourk-noncritical schatzki ring, abnormal and pillow   . Colonoscopy  07/31/10    Rourk-hyperplastic polyp  . Esophagogastroduodenoscopy  04/2008    Rourk-mild erosive reflux esophagitis, noncritical Schatzki's ring status post dilation, 1.5 cm pedunculated mass distal D2  . Eus  6/09    Jacobs-6-8 MM nodule seen with recent mucosal biopsy 2-3 cm distal to the major papilla, biopsy benign, 1 cm or adenomatous-appearing mucus at the major papilla biopsy (focal mild inflammation associated with slight atypia)  . Esophagogastroduodenoscopy  10/09    [facility]-normal    Family History  Problem Relation Age of Onset  . Diabetes Mother   . Breast cancer Mother   .  Hypertension Other   . Hyperlipidemia Other   . Cancer Other   . Colon polyps Father 2    Social History Social History  Substance Use Topics  . Smoking status: Current Some Day Smoker -- 0.20 packs/day for 25 years    Types: Cigarettes  . Smokeless tobacco: Never Used  . Alcohol Use: Yes     Comment: rarely couple times per yr    No Known Allergies  Current Outpatient Prescriptions  Medication Sig Dispense Refill  . acetaminophen (KLS RAPID RELEASE PAIN) 500 MG tablet Take 500 mg by mouth every 6 (six) hours as needed for pain. Reported on 01/08/2016    . diclofenac (VOLTAREN) 75 MG EC tablet Take 75 mg by mouth 2 (two) times daily.    . Estradiol (VIVELLE-DOT TD) Place 1 patch onto the skin 2 (two) times a week. Reported on 01/08/2016    . HYDROcodone-acetaminophen (NORCO/VICODIN) 5-325 MG tablet Take 1 tablet by mouth every 4 (four) hours as needed for moderate pain (Must last 30 days.  Do not take and drive a car or use machinery.). 120 tablet 0  . ibuprofen (ADVIL,MOTRIN) 600 MG tablet Take 1 tablet (600 mg total) by mouth every 6 (six) hours as needed for pain. 20 tablet 0  . lidocaine (XYLOCAINE) 2 %  solution Take 15 mLs by mouth as needed for pain. 100 mL 0  . lidocaine (XYLOCAINE) 2 % solution Take 15 mLs by mouth as needed for pain. 100 mL 0  . Multiple Vitamin (MULTIVITAMIN) capsule Take 1 capsule by mouth daily. Reported on 01/08/2016    . PARoxetine (PAXIL) 40 MG tablet Take 40 mg by mouth every morning.    . traMADol (ULTRAM) 50 MG tablet Take 50 mg by mouth every 6 (six) hours as needed. For pain     No current facility-administered medications for this visit.     Physical Exam  Blood pressure 136/85, pulse 102, temperature 97.2 F (36.2 C), height 5\' 5"  (1.651 m), weight 194 lb 9.6 oz (88.27 kg).  Constitutional: overall normal hygiene, normal nutrition, well developed, normal grooming, normal body habitus. Assistive device:none  Musculoskeletal: gait and station  Limp none, muscle tone and strength are normal, no tremors or atrophy is present.  .  Neurological: coordination overall normal.  Deep tendon reflex/nerve stretch intact.  Sensation normal.  Cranial nerves II-XII intact.   Skin:   normal overall no scars, lesions, ulcers or rashes. No psoriasis.  Psychiatric: Alert and oriented x 3.  Recent memory intact, remote memory unclear.  Normal mood and affect. Well groomed.  Good eye contact.  Cardiovascular: overall no swelling, no varicosities, no edema bilaterally, normal temperatures of the legs and arms, no clubbing, cyanosis and good capillary refill.  Lymphatic: palpation is normal.  Spine/Pelvis examination:  Inspection:  Overall, sacoiliac joint benign and hips nontender; without crepitus or defects.   Thoracic spine inspection: Alignment normal without kyphosis present   Lumbar spine inspection:  Alignment  with normal lumbar lordosis, without scoliosis apparent.   Thoracic spine palpation:  with tenderness of spinal processes   Lumbar spine palpation: with tenderness of lumbar area; without tightness of lumbar muscles    Range of Motion:   Lumbar flexion, forward flexion is 40  without pain or tenderness    Lumbar extension is 10  without pain or tenderness   Left lateral bend is Normal  without pain or tenderness   Right lateral bend is Normal without pain or tenderness   Straight leg raising is Normal   Strength & tone: Normal   Stability overall normal stability     The patient has been educated about the nature of the problem(s) and counseled on treatment options.  The patient appeared to understand what I have discussed and is in agreement with it.  PLAN Call if any problems.  Precautions discussed.  Continue current medications.   Return to clinic 6 weeks

## 2016-02-05 NOTE — Patient Instructions (Signed)
Smoking Cessation, Tips for Success If you are ready to quit smoking, congratulations! You have chosen to help yourself be healthier. Cigarettes bring nicotine, tar, carbon monoxide, and other irritants into your body. Your lungs, heart, and blood vessels will be able to work better without these poisons. There are many different ways to quit smoking. Nicotine gum, nicotine patches, a nicotine inhaler, or nicotine nasal spray can help with physical craving. Hypnosis, support groups, and medicines help break the habit of smoking. WHAT THINGS CAN I DO TO MAKE QUITTING EASIER?  Here are some tips to help you quit for good:  Pick a date when you will quit smoking completely. Tell all of your friends and family about your plan to quit on that date.  Do not try to slowly cut down on the number of cigarettes you are smoking. Pick a quit date and quit smoking completely starting on that day.  Throw away all cigarettes.   Clean and remove all ashtrays from your home, work, and car.  On a card, write down your reasons for quitting. Carry the card with you and read it when you get the urge to smoke.  Cleanse your body of nicotine. Drink enough water and fluids to keep your urine clear or pale yellow. Do this after quitting to flush the nicotine from your body.  Learn to predict your moods. Do not let a bad situation be your excuse to have a cigarette. Some situations in your life might tempt you into wanting a cigarette.  Never have "just one" cigarette. It leads to wanting another and another. Remind yourself of your decision to quit.  Change habits associated with smoking. If you smoked while driving or when feeling stressed, try other activities to replace smoking. Stand up when drinking your coffee. Brush your teeth after eating. Sit in a different chair when you read the paper. Avoid alcohol while trying to quit, and try to drink fewer caffeinated beverages. Alcohol and caffeine may urge you to  smoke.  Avoid foods and drinks that can trigger a desire to smoke, such as sugary or spicy foods and alcohol.  Ask people who smoke not to smoke around you.  Have something planned to do right after eating or having a cup of coffee. For example, plan to take a walk or exercise.  Try a relaxation exercise to calm you down and decrease your stress. Remember, you may be tense and nervous for the first 2 weeks after you quit, but this will pass.  Find new activities to keep your hands busy. Play with a pen, coin, or rubber band. Doodle or draw things on paper.  Brush your teeth right after eating. This will help cut down on the craving for the taste of tobacco after meals. You can also try mouthwash.   Use oral substitutes in place of cigarettes. Try using lemon drops, carrots, cinnamon sticks, or chewing gum. Keep them handy so they are available when you have the urge to smoke.  When you have the urge to smoke, try deep breathing.  Designate your home as a nonsmoking area.  If you are a heavy smoker, ask your health care provider about a prescription for nicotine chewing gum. It can ease your withdrawal from nicotine.  Reward yourself. Set aside the cigarette money you save and buy yourself something nice.  Look for support from others. Join a support group or smoking cessation program. Ask someone at home or at work to help you with your plan   to quit smoking.  Always ask yourself, "Do I need this cigarette or is this just a reflex?" Tell yourself, "Today, I choose not to smoke," or "I do not want to smoke." You are reminding yourself of your decision to quit.  Do not replace cigarette smoking with electronic cigarettes (commonly called e-cigarettes). The safety of e-cigarettes is unknown, and some may contain harmful chemicals.  If you relapse, do not give up! Plan ahead and think about what you will do the next time you get the urge to smoke. HOW WILL I FEEL WHEN I QUIT SMOKING? You  may have symptoms of withdrawal because your body is used to nicotine (the addictive substance in cigarettes). You may crave cigarettes, be irritable, feel very hungry, cough often, get headaches, or have difficulty concentrating. The withdrawal symptoms are only temporary. They are strongest when you first quit but will go away within 10-14 days. When withdrawal symptoms occur, stay in control. Think about your reasons for quitting. Remind yourself that these are signs that your body is healing and getting used to being without cigarettes. Remember that withdrawal symptoms are easier to treat than the major diseases that smoking can cause.  Even after the withdrawal is over, expect periodic urges to smoke. However, these cravings are generally short lived and will go away whether you smoke or not. Do not smoke! WHAT RESOURCES ARE AVAILABLE TO HELP ME QUIT SMOKING? Your health care provider can direct you to community resources or hospitals for support, which may include:  Group support.  Education.  Hypnosis.  Therapy.   This information is not intended to replace advice given to you by your health care provider. Make sure you discuss any questions you have with your health care provider.   Document Released: 08/20/2004 Document Revised: 12/13/2014 Document Reviewed: 05/10/2013 Elsevier Interactive Patient Education 2016 [employer]  

## 2016-03-03 ENCOUNTER — Telehealth: Payer: Self-pay | Admitting: Orthopaedic Surgery

## 2016-03-03 NOTE — Telephone Encounter (Signed)
Patient called for refill of medication: HYDROcodone-acetaminophen (NORCO/VICODIN) 5-325 MG tablet [id number] - quantity 120 - patient ph# [phone]

## 2016-03-04 MED ORDER — HYDROCODONE-ACETAMINOPHEN 5-325 MG PO TABS: 1.0000 | ORAL_TABLET | ORAL | Status: DC | PRN

## 2016-03-04 NOTE — Telephone Encounter (Signed)
Rx done. 

## 2016-03-09 ENCOUNTER — Encounter: Payer: Self-pay | Admitting: Internal Medicine

## 2016-03-11 ENCOUNTER — Encounter (HOSPITAL_COMMUNITY): Payer: Self-pay | Admitting: Emergency Medicine

## 2016-03-11 ENCOUNTER — Emergency Department (HOSPITAL_COMMUNITY): Payer: Medicare Other

## 2016-03-11 ENCOUNTER — Emergency Department (HOSPITAL_COMMUNITY)
Admission: EM | Admit: 2016-03-11 | Discharge: 2016-03-11 | Disposition: A | Discharge status: Home / Self Care | Payer: Medicare Other | Attending: Emergency Medicine | Admitting: Emergency Medicine

## 2016-03-11 DIAGNOSIS — Z79899 Other long term (current) drug therapy: Secondary | ICD-10-CM | POA: Insufficient documentation

## 2016-03-11 DIAGNOSIS — K625 Hemorrhage of anus and rectum: Secondary | ICD-10-CM | POA: Insufficient documentation

## 2016-03-11 DIAGNOSIS — Z87891 Personal history of nicotine dependence: Secondary | ICD-10-CM | POA: Diagnosis not present

## 2016-03-11 DIAGNOSIS — M79642 Pain in left hand: Secondary | ICD-10-CM | POA: Diagnosis not present

## 2016-03-11 DIAGNOSIS — R1084 Generalized abdominal pain: Secondary | ICD-10-CM | POA: Diagnosis present

## 2016-03-11 LAB — CBC
HEMATOCRIT: 41.9 % (ref 36.0–46.0)
HEMOGLOBIN: 13.9 g/dL (ref 12.0–15.0)
MCH: 30.3 pg (ref 26.0–34.0)
MCHC: 33.2 g/dL (ref 30.0–36.0)
MCV: 91.5 fL (ref 78.0–100.0)
Platelets: 201 10*3/uL (ref 150–400)
RBC: 4.58 MIL/uL (ref 3.87–5.11)
RDW: 13.4 % (ref 11.5–15.5)
WBC: 6.2 10*3/uL (ref 4.0–10.5)

## 2016-03-11 LAB — URINALYSIS, ROUTINE W REFLEX MICROSCOPIC
BILIRUBIN URINE: NEGATIVE
Glucose, UA: NEGATIVE mg/dL
Ketones, ur: NEGATIVE mg/dL
Leukocytes, UA: NEGATIVE
NITRITE: NEGATIVE
PH: 6 (ref 5.0–8.0)
Protein, ur: NEGATIVE mg/dL
SPECIFIC GRAVITY, URINE: 1.02 (ref 1.005–1.030)

## 2016-03-11 LAB — COMPREHENSIVE METABOLIC PANEL
ALBUMIN: 4 g/dL (ref 3.5–5.0)
ALT: 22 U/L (ref 14–54)
ANION GAP: 7 (ref 5–15)
AST: 21 U/L (ref 15–41)
Alkaline Phosphatase: 106 U/L (ref 38–126)
BUN: 14 mg/dL (ref 6–20)
CO2: 27 mmol/L (ref 22–32)
Calcium: 9.3 mg/dL (ref 8.9–10.3)
Chloride: 106 mmol/L (ref 101–111)
Creatinine, Ser: 0.74 mg/dL (ref 0.44–1.00)
GFR calc Af Amer: >60 mL/min (ref >60)
GFR calc non Af Amer: >60 mL/min (ref >60)
GLUCOSE: 143 mg/dL — AB (ref 65–99)
POTASSIUM: 3.7 mmol/L (ref 3.5–5.1)
SODIUM: 140 mmol/L (ref 135–145)
Total Bilirubin: 0.4 mg/dL (ref 0.3–1.2)
Total Protein: 7.2 g/dL (ref 6.5–8.1)

## 2016-03-11 LAB — URINE MICROSCOPIC-ADD ON

## 2016-03-11 LAB — LIPASE, BLOOD: Lipase: 24 U/L (ref 11–51)

## 2016-03-11 LAB — PREGNANCY, URINE: PREG TEST UR: NEGATIVE

## 2016-03-11 LAB — I-STAT BETA HCG BLOOD, ED (MC, WL, AP ONLY)

## 2016-03-11 LAB — POC OCCULT BLOOD, ED: Fecal Occult Bld: POSITIVE — AB

## 2016-03-11 MED ORDER — ONDANSETRON 4 MG PO TBDP: ORAL_TABLET | ORAL | Status: DC

## 2016-03-11 NOTE — ED Provider Notes (Signed)
CSN: [id number]     Arrival date & time 03/11/16  1531 History   First MD Initiated Contact with Patient 03/11/16 1710     Chief Complaint  Patient presents with  . Abdominal Pain  . Rectal Bleeding     (Consider location/radiation/quality/duration/timing/severity/associated sxs/prior Treatment) Patient is a 45 y.o. female presenting with abdominal pain and hematochezia. The history is provided by the patient (Patient complains rectal bleeding also pain in her left hand. With some nausea).  Abdominal Pain Pain location:  Generalized Pain quality: aching   Pain radiates to:  Does not radiate Pain severity:  Mild Onset quality:  Gradual Timing:  Intermittent Progression:  Waxing and waning Chronicity:  New Context: not alcohol use   Associated symptoms: hematochezia   Associated symptoms: no chest pain, no cough, no diarrhea, no fatigue and no hematuria   Rectal Bleeding Associated symptoms: abdominal pain     Past Medical History  Diagnosis Date  . Non Hodgkin's lymphoma ([place]) 2011  . DDD (degenerative disc disease)   . Abdominal wall pain   . Hearing loss   . Fatty liver   . Fibroid, uterine   . Marijuana abuse   . Chronic back pain    Past Surgical History  Procedure Laterality Date  . Abdominal hysterectomy  2008    complete  . Lymphadenectomy  2011  . Esophagogastroduodenoscopy  08/19/10    Rourk-noncritical schatzki ring, abnormal and pillow   . Colonoscopy  07/31/10    Rourk-hyperplastic polyp  . Esophagogastroduodenoscopy  04/2008    Rourk-mild erosive reflux esophagitis, noncritical Schatzki's ring status post dilation, 1.5 cm pedunculated mass distal D2  . Eus  6/09    Jacobs-6-8 MM nodule seen with recent mucosal biopsy 2-3 cm distal to the major papilla, biopsy benign, 1 cm or adenomatous-appearing mucus at the major papilla biopsy (focal mild inflammation associated with slight atypia)  . Esophagogastroduodenoscopy  10/09    [facility]-normal   Family History   Problem Relation Age of Onset  . Diabetes Mother   . Breast cancer Mother   . Hypertension Other   . Hyperlipidemia Other   . Cancer Other   . Colon polyps Father 94   Social History  Substance Use Topics  . Smoking status: Former Smoker -- 0.20 packs/day for 25 years    Types: Cigarettes  . Smokeless tobacco: Never Used  . Alcohol Use: Yes     Comment: rarely couple times per yr   OB History    No data available     Review of Systems  Constitutional: Negative for appetite change and fatigue.  HENT: Negative for congestion, ear discharge and sinus pressure.   Eyes: Negative for discharge.  Respiratory: Negative for cough.   Cardiovascular: Negative for chest pain.  Gastrointestinal: Positive for abdominal pain and hematochezia. Negative for diarrhea.       Rectal bleeding  Genitourinary: Negative for frequency and hematuria.  Musculoskeletal: Negative for back pain.       Pain left hand  Skin: Negative for rash.  Neurological: Negative for seizures and headaches.  Psychiatric/Behavioral: Negative for hallucinations.      Allergies  Review of patient's allergies indicates no known allergies.  Home Medications   Prior to Admission medications   Medication Sig Start Date End Date Taking? Authorizing Provider  HYDROcodone-acetaminophen (NORCO/VICODIN) 5-325 MG tablet Take 1 tablet by mouth every 4 (four) hours as needed for moderate pain (Must last 30 days.  Do not take and drive a car  or use machinery.). 03/04/16  Yes Sanjuana Kava, MD  PARoxetine (PAXIL) 40 MG tablet Take 40 mg by mouth every morning.   Yes Historical Provider, MD  traMADol (ULTRAM) 50 MG tablet Take 50 mg by mouth every 6 (six) hours as needed. For pain   Yes Historical Provider, MD   BP 140/96 mmHg  Pulse 90  Temp(Src) 98.2 F (36.8 C) (Oral)  Resp 18  Ht 5\' 5"  (1.651 m)  Wt 190 lb (86.183 kg)  BMI 31.62 kg/m2  SpO2 99% Physical Exam  Constitutional: She is oriented to person, place, and  time. She appears well-developed.  HENT:  Head: Normocephalic.  Eyes: Conjunctivae and EOM are normal. No scleral icterus.  Neck: Neck supple. No thyromegaly present.  Cardiovascular: Normal rate and regular rhythm.  Exam reveals no gallop and no friction rub.   No murmur heard. Pulmonary/Chest: No stridor. She has no wheezes. She has no rales. She exhibits no tenderness.  Abdominal: She exhibits no distension. There is no tenderness. There is no rebound.  Genitourinary:  Rectal exam shows brown stool normal sphincter tone and heme positive  Musculoskeletal: Normal range of motion. She exhibits no edema.  Left hand tenderness dorsum of hand feels like a cyst  Lymphadenopathy:    She has no cervical adenopathy.  Neurological: She is oriented to person, place, and time. She exhibits normal muscle tone. Coordination normal.  Skin: No rash noted. No erythema.  Psychiatric: She has a normal mood and affect. Her behavior is normal.    ED Course  Procedures (including critical care time) Labs Review Labs Reviewed  COMPREHENSIVE METABOLIC PANEL - Abnormal; Notable for the following:    Glucose, Bld 143 (*)    All other components within normal limits  URINALYSIS, ROUTINE W REFLEX MICROSCOPIC (NOT AT [facility]) - Abnormal; Notable for the following:    Hgb urine dipstick TRACE (*)    All other components within normal limits  URINE MICROSCOPIC-ADD ON - Abnormal; Notable for the following:    Squamous Epithelial / LPF 0-5 (*)    Bacteria, UA RARE (*)    All other components within normal limits  LIPASE, BLOOD  CBC  PREGNANCY, URINE  I-STAT BETA HCG BLOOD, ED ([facility], [facility], [facility] ONLY)    Imaging Review Dg Abd Acute W/chest  03/11/2016  CLINICAL DATA:  Abdominal pain, nausea and bright red stool. EXAM: DG ABDOMEN ACUTE W/ 1V CHEST COMPARISON:  None. FINDINGS: Normal heart size. No pleural effusion or edema. No airspace consolidation. The bowel gas pattern is nonobstructed. There is no dilated loop of  small bowel or air-fluid levels identified. There is a moderate stool burden identified throughout the colon. IMPRESSION: 1. No acute cardiopulmonary abnormalities. 2. Nonobstructive bowel gas pattern. 3. Moderate stool burden within the colon. Electronically Signed   By: Kerby Moors M.D.   On: 03/11/2016 19:23   Dg Hand Complete Left  03/11/2016  CLINICAL DATA:  Left index finger knot EXAM: LEFT HAND - COMPLETE 3+ VIEW COMPARISON:  None. FINDINGS: Three views of the left hand submitted. No acute fracture or subluxation. No periosteal reaction or bony erosion. No radiopaque foreign body. IMPRESSION: Negative. Electronically Signed   By: Lahoma Crocker M.D.   On: 03/11/2016 19:25   I have personally reviewed and evaluated these images and lab results as part of my medical decision-making.   EKG Interpretation None      MDM   Final diagnoses:  None    Labs and x-rays unremarkable. Rectal  bleeding possibly related to internal hemorrhoids and. She will be referred back to her family doctor. She also has a GI appointment that she will discuss bleeding with that doctor. Her left hand pain she's been referred to hand doctor to evaluate. Patient given Zofran for nausea    Milton Ferguson, MD 03/11/16 2012

## 2016-03-11 NOTE — Discharge Instructions (Signed)
Follow-up with Dr. Grandville Silos about her hand. Follow-up with your family doctor about her rectal bleeding

## 2016-03-11 NOTE — ED Notes (Signed)
Pt c/o abdominal pain, nausea, and bright red blood in stools that began today. Pt denies hx of hemorrhoids. Pt also c/o a knot to LT index finger.

## 2016-03-15 ENCOUNTER — Telehealth: Payer: Self-pay

## 2016-03-15 NOTE — Telephone Encounter (Signed)
Pt called- her pcp had sent over a referral and pt has appt at the end of April. The last time we saw the pt was 2013. She went to the ED last week with abd pain, nausea, cramping and rectal bleeding. Pt isnt getting any better. I tried to call pt, we had a opening and I have put the pt on the schedule with EG at 9:30 tomorrow morning. I have left her a message and I asked her to call back if she could not come to this appt.

## 2016-03-15 NOTE — Telephone Encounter (Signed)
Noted, thanks!

## 2016-03-16 ENCOUNTER — Ambulatory Visit: Payer: Medicare Other | Admitting: Nurse Practitioner

## 2016-03-18 ENCOUNTER — Ambulatory Visit: Payer: Medicare Other | Admitting: Orthopaedic Surgery

## 2016-03-18 ENCOUNTER — Encounter: Payer: Self-pay | Admitting: Orthopaedic Surgery

## 2016-03-31 ENCOUNTER — Encounter: Payer: Self-pay | Admitting: Orthopaedic Surgery

## 2016-03-31 ENCOUNTER — Ambulatory Visit (INDEPENDENT_AMBULATORY_CARE_PROVIDER_SITE_OTHER): Payer: Medicare Other | Admitting: Orthopaedic Surgery

## 2016-03-31 VITALS — BP 134/88 | HR 91 | Temp 97.5°F | Resp 16 | Ht 65.0 in | Wt 190.0 lb

## 2016-03-31 DIAGNOSIS — M545 Low back pain, unspecified: Secondary | ICD-10-CM

## 2016-03-31 MED ORDER — HYDROCODONE-ACETAMINOPHEN 5-325 MG PO TABS: 1.0000 | ORAL_TABLET | ORAL | Status: DC | PRN

## 2016-03-31 NOTE — Progress Notes (Signed)
Patient [name], female DOB:[birth year], 45 y.o. [id number]  Chief Complaint  Patient presents with  . Follow-up    back pain    HPI  Donna Ramirez is a 45 y.o. female who has chronic lower back pain.  She has no new trauma, no paresthesias, no weakness, no bowel or bladder problems.  She is doing her exercises and taking her medicine.  She has a prior chronic problem with the left lower leg and uses a cane.  HPI  Body mass index is 31.62 kg/(m^2).  Review of Systems  Constitutional:       Patient does not have Diabetes Mellitus. Patient does not have hypertension. Patient does not have COPD or shortness of breath. Patient does not have BMI > 35. Patient has current smoking history.  HENT: Negative for congestion.   Respiratory: Negative for cough and shortness of breath.   Cardiovascular: Negative for chest pain and leg swelling.  Gastrointestinal:       Fatty Liver  Endocrine: Negative for cold intolerance.       Non Hodgkins Lymphoma  Musculoskeletal: Positive for back pain.  Allergic/Immunologic: Positive for environmental allergies.    Past Medical History  Diagnosis Date  . Non Hodgkin's lymphoma ([place]) 2011  . DDD (degenerative disc disease)   . Abdominal wall pain   . Hearing loss   . Fatty liver   . Fibroid, uterine   . Marijuana abuse   . Chronic back pain     Past Surgical History  Procedure Laterality Date  . Abdominal hysterectomy  2008    complete  . Lymphadenectomy  2011  . Esophagogastroduodenoscopy  08/19/10    Rourk-noncritical schatzki ring, abnormal and pillow   . Colonoscopy  07/31/10    Rourk-hyperplastic polyp  . Esophagogastroduodenoscopy  04/2008    Rourk-mild erosive reflux esophagitis, noncritical Schatzki's ring status post dilation, 1.5 cm pedunculated mass distal D2  . Eus  6/09    Jacobs-6-8 MM nodule seen with recent mucosal biopsy 2-3 cm distal to the major papilla, biopsy benign, 1 cm or adenomatous-appearing mucus at  the major papilla biopsy (focal mild inflammation associated with slight atypia)  . Esophagogastroduodenoscopy  10/09    [facility]-normal    Family History  Problem Relation Age of Onset  . Diabetes Mother   . Breast cancer Mother   . Hypertension Other   . Hyperlipidemia Other   . Cancer Other   . Colon polyps Father 76    Social History Social History  Substance Use Topics  . Smoking status: Current Every Day Smoker -- 0.20 packs/day for 25 years    Types: Cigarettes  . Smokeless tobacco: Never Used  . Alcohol Use: 0.0 oz/week    0 Standard drinks or equivalent per week     Comment: rarely couple times per yr    No Known Allergies  Current Outpatient Prescriptions  Medication Sig Dispense Refill  . HYDROcodone-acetaminophen (NORCO/VICODIN) 5-325 MG tablet Take 1 tablet by mouth every 4 (four) hours as needed for moderate pain (Must last 30 days.  Do not take and drive a car or use machinery.). 120 tablet 0  . ondansetron (ZOFRAN ODT) 4 MG disintegrating tablet 4mg  ODT q4 hours prn nausea/vomit 12 tablet 0  . PARoxetine (PAXIL) 40 MG tablet Take 40 mg by mouth every morning.     No current facility-administered medications for this visit.     Physical Exam  Blood pressure 134/88, pulse 91, temperature 97.5 F (36.4 C), resp.  rate 16, height 5\' 5"  (1.651 m), weight 190 lb (86.183 kg).  Constitutional: overall normal hygiene, normal nutrition, well developed, normal grooming, normal body habitus. Assistive device:cane  Musculoskeletal: gait and station Limp left, muscle tone and strength are normal, no tremors or atrophy is present.  .  Neurological: coordination overall normal.  Deep tendon reflex/nerve stretch intact.  Sensation normal.  Cranial nerves II-XII intact.   Skin:   normal overall no scars, lesions, ulcers or rashes. No psoriasis.  Psychiatric: Alert and oriented x 3.  Recent memory intact, remote memory unclear.  Normal mood and affect. Well groomed.  Good  eye contact.  Cardiovascular: overall no swelling, no varicosities, no edema bilaterally, normal temperatures of the legs and arms, no clubbing, cyanosis and good capillary refill.  Lymphatic: palpation is normal.  Spine/Pelvis examination:  Inspection:  Overall, sacoiliac joint benign and hips nontender; without crepitus or defects.   Thoracic spine inspection: Alignment normal without kyphosis present   Lumbar spine inspection:  Alignment  with normal lumbar lordosis, without scoliosis apparent.   Thoracic spine palpation:  without tenderness of spinal processes   Lumbar spine palpation: with tenderness of lumbar area; without tightness of lumbar muscles    Range of Motion:   Lumbar flexion, forward flexion is full  without pain or tenderness    Lumbar extension is full  without pain or tenderness   Left lateral bend is Normal  without pain or tenderness   Right lateral bend is Normal without pain or tenderness   Straight leg raising is Normal   Strength & tone: Normal   Stability overall normal stability   The patient has been educated about the nature of the problem(s) and counseled on treatment options.  The patient appeared to understand what I have discussed and is in agreement with it.  Encounter Diagnosis  Name Primary?  . Midline low back pain without sciatica Yes    PLAN Call if any problems.  Precautions discussed.  Continue current medications.   Return to clinic 3 months   Continue exercises at home.

## 2016-04-01 ENCOUNTER — Encounter: Payer: Self-pay | Admitting: Nurse Practitioner

## 2016-04-01 ENCOUNTER — Ambulatory Visit (INDEPENDENT_AMBULATORY_CARE_PROVIDER_SITE_OTHER): Payer: Medicare Other | Admitting: Nurse Practitioner

## 2016-04-01 ENCOUNTER — Other Ambulatory Visit: Payer: Self-pay

## 2016-04-01 VITALS — BP 137/89 | HR 86 | Temp 96.7°F | Ht 65.0 in | Wt 188.2 lb

## 2016-04-01 DIAGNOSIS — R112 Nausea with vomiting, unspecified: Secondary | ICD-10-CM

## 2016-04-01 DIAGNOSIS — R1013 Epigastric pain: Secondary | ICD-10-CM

## 2016-04-01 DIAGNOSIS — K921 Melena: Secondary | ICD-10-CM | POA: Diagnosis not present

## 2016-04-01 DIAGNOSIS — R1310 Dysphagia, unspecified: Secondary | ICD-10-CM

## 2016-04-01 MED ORDER — PEG 3350-KCL-NA BICARB-NACL 420 G PO SOLR: 4000.0000 mL | ORAL | Status: DC

## 2016-04-01 NOTE — Patient Instructions (Signed)
1. We'll schedule your procedures for you. 2. Continue taking Zofran. 3. Work on marijuana cessation as this can lead to cannabinoid hyperemesis syndrome which is a phenomenon in some individuals where the use of canabis products causes persistent and sometimes severe nausea. 4. Return for follow-up in 2-3 months for postprocedure evaluation.

## 2016-04-01 NOTE — Progress Notes (Signed)
CC'ED TO PCP 

## 2016-04-01 NOTE — Assessment & Plan Note (Signed)
Patient with significant nausea symptoms and occasional vomiting. Last episode of emesis approximately 3-4 weeks ago. It is worth noting she is having other dysphagia symptoms as well and as noted below. However, she does smoke marijuana about 4 times a week. We discussed cannabinoid hyperemesis syndrome and the need to stop smoking marijuana to see if this contributing to her symptoms. She is amenable to that and will make the effort. Return for follow-up in 2 months.

## 2016-04-01 NOTE — Assessment & Plan Note (Signed)
Patient with significant dyspepsia symptoms including nausea/vomiting (as discussed above), belching with a bitter taste afterward as well as regurgitation. Also with some abdominal pain noted as well. This point we'll proceed with endoscopy with possible dilation as noted above. Return for follow-up in 2 months.

## 2016-04-01 NOTE — Assessment & Plan Note (Addendum)
Noted symptoms of dysphagia approximately 1 time a week. Has previously had Schatzki's ring dilated. At this point we'll proceed with endoscopy with possible dilation for further evaluation. It is also worth noting she has a history of non-Hodgkin's lymphoma and as well as an abnormal appearing ampulla which is deemed to be likely non-neoplastic on endoscopic ultrasound completed in 2009. Return for follow-up in 2 months  Proceed with EGD +/- with Dr. Gala Romney in near future: the risks, benefits, and alternatives have been discussed with the patient in detail. The patient states understanding and desires to proceed.  The patient is currently on tramadol, Paxil, Norco. No anticoagulants. Given this and her chronic and cannaboid use we will plan for the procedure on propofol/MAC to ensure adequate sedation.

## 2016-04-01 NOTE — Progress Notes (Signed)
Primary Care Physician:  Ardine Eng, MD Primary Gastroenterologist:  Dr. Gala Romney  Chief Complaint  Patient presents with  . set up EGD    HPI:   Donna Ramirez is a 45 y.o. female who presents on referral from [facility] for upper endoscopy. PCP notes reviewed, last saw PCP on 03/04/2016. Notes patient has had nausea for approximately 2 months with all foods making her nauseated, no vomiting or diarrhea, some periumbilical discomfort without reflux, sour taste, heartburn symptoms. No prior history of H. pylori. 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 Previously smoked marijuana a few nights a week but based on visit notes patient has quit smoking tobacco and marijuana.  She previously had extensive evaluation for chronic abdominal pain by [place] this included a colonoscopy dated 07/30/2010 which found rectosigmoid polyps removed, otherwise normal. Surgical pathology found the polyps to be hyperplastic. This was followed by an upper endoscopy on 08/19/2010 which found noncritical Schatzki's ring, otherwise normal esophagus, small gastric erosions, patent pylorus, subtle abnormality of the area of the ampulla of uncertain significance status post biopsy. Surgical pathology found the ampullary biopsy to be benign duodenal mucosa with gastric metaplasia with noted features suggested of peptic duodenitis. She did have a follow-up endoscopic ultrasound related to the abnormal ampulla which found correlation with an anechoic, round 6 mm lesion in the mucosal layer of the duodenal wall not concerning for neoplasm and may be small duplication cyst. Noted limited views of pancreas, liver, spleen, portal vessels all noted as normal.  Today she states her nausea is improved, Zofran helps. Nausea started a couple months ago.  Some vomiting ("not a lot, but some"). Last episode of emesis about 3-4 weeks ago. No hematemesis. Was also recently seen in the ER for abdominal pain and hematochezia. Labs and XRays were unremarkable. Abdominal pain continues unless she takes Ultram, typically periumbilical, described as an ache. No relation between abdominal pain and nausea. Nausea triggered by food of all types. Also has some epigastric discomfort, denies esophageal burning. When belching has some regurgitation, also with some dysphagia about once a week. Denies melena. Denies fever, chills, unintentional weight loss. Denies chest pain, dyspnea, dizziness, lightheadedness, syncope, near syncope. Denies any other upper or lower GI symptoms.  Past Medical History  Diagnosis Date  . Non Hodgkin's lymphoma ([place]) 2011  . DDD (degenerative disc disease)   . Abdominal wall pain   . Hearing loss   . Fatty liver   . Fibroid, uterine   . Marijuana abuse   . Chronic back pain     Past Surgical History  Procedure Laterality Date  . Abdominal hysterectomy  2008    complete  . Lymphadenectomy  2011  . Esophagogastroduodenoscopy  08/19/10    Rourk-noncritical schatzki ring, abnormal and pillow   . Colonoscopy  07/31/10    Rourk-hyperplastic polyp  . Esophagogastroduodenoscopy  04/2008    Rourk-mild erosive reflux esophagitis, noncritical Schatzki's ring status post dilation, 1.5 cm pedunculated mass distal D2  . Eus  6/09    Jacobs-6-8 MM nodule seen with recent mucosal biopsy 2-3 cm distal to the major papilla, biopsy benign, 1 cm or adenomatous-appearing mucus at the major papilla biopsy (focal mild inflammation associated with slight atypia)  . Esophagogastroduodenoscopy  10/09    [facility]-normal    Current Outpatient Prescriptions  Medication Sig Dispense Refill  . ondansetron (ZOFRAN ODT) 4 MG disintegrating tablet 4mg  ODT q4 hours prn nausea/vomit 12 tablet 0  . PARoxetine (PAXIL) 40 MG tablet Take 40 mg by mouth every morning.     . traMADol (ULTRAM) 50 MG tablet Take by mouth every 12 (twelve) hours as needed.    Marland Kitchen HYDROcodone-acetaminophen (NORCO/VICODIN) 5-325 MG tablet Take 1 tablet by mouth every 4 (four) hours as needed for moderate pain (Must last 30 days.  Do not take and drive a car or use machinery.). (Patient not taking: Reported on 04/01/2016) 120 tablet 0   No current facility-administered medications for this visit.    Allergies as of 04/01/2016  . (No Known Allergies)    Family History  Problem Relation Age of Onset  . Diabetes Mother   . Breast cancer Mother   . Hypertension Other   . Hyperlipidemia Other   . Cancer Other   . Colon polyps Father 108    Social History   Social History  . Marital Status: Single    Spouse Name: N/A  . Number of Children: 0  . Years of Education: N/A   Occupational History  . disabled    Social History Main Topics  . Smoking status: Current Every Day Smoker -- 0.20 packs/day for 25 years    Types: Cigarettes  . Smokeless tobacco: Never Used  . Alcohol Use: 0.0 oz/week    0 Standard drinks or equivalent per week     Comment: rarely couple times per yr  . Drug Use: Yes    Special: Marijuana     Comment: no marijuana in 4-5 mo  . Sexual Activity: No   Other Topics Concern  . Not on file   Social History Narrative    Review of Systems: General: Negative for anorexia, weight loss, fever, chills, fatigue, weakness. ENT: Negative for hoarseness, difficulty swallowing. CV: Negative for chest pain, angina, palpitations, peripheral edema.  Respiratory: Negative for dyspnea at rest, cough, sputum, wheezing.  GI: See history of present illness. MS: Chronic pain.  Derm: Negative for rash or itching.  Endo: Negative for unusual weight change.  Heme: Negative for bruising or bleeding. Allergy: Negative for rash or hives.    Physical Exam: BP 137/89 mmHg  Pulse 86  Temp(Src) 96.7 F (35.9 C)  Ht 5\' 5"  (1.651 m)  Wt 188 lb 3.2 oz (85.367 kg)   BMI 31.32 kg/m2 General:   Alert and oriented. Pleasant and cooperative. Well-nourished and well-developed.  Head:  Normocephalic and atraumatic. Eyes:  Without icterus, sclera clear and conjunctiva pink.  Ears:  Normal auditory acuity. Cardiovascular:  S1, S2 present without murmurs appreciated. Extremities without clubbing or edema. Respiratory:  Clear to auscultation bilaterally. No wheezes, rales, or rhonchi. No distress.  Gastrointestinal:  +BS, soft, and non-distended. Mild periumbilical/mid-abdominal pain. No HSM noted. No guarding or rebound. No masses appreciated.  Rectal:  Deferred  Musculoskalatal:  Symmetrical without gross deformities Skin:  Intact without significant lesions or rashes. Neurologic:  Alert and oriented x4;  grossly normal neurologically. Psych:  Alert and cooperative. Normal mood and affect. Heme/Lymph/Immune: No excessive bruising noted.    04/01/2016 8:44 AM   Disclaimer: This note was dictated with voice recognition software. Similar sounding words can inadvertently be transcribed and may not be corrected upon review.

## 2016-04-01 NOTE — Assessment & Plan Note (Signed)
Patient describes episode of hematochezia a couple weeks ago for which she presented to the emergency room. Labs and x-rays were normal, no profound anemia noted. She was recommended to follow-up with GI. She does have a family history of frequent polyps in her father as well as paternal grandmother with colon cancer. Her last colonoscopy was 6 years ago. She is African-American female age 45 and given this as well as her presenting symptoms and family history we will add on colonoscopy in addition to the endoscopy as noted above. Return for follow-up in 2 months.  Proceed with TCS with Dr. Gala Romney in near future: the risks, benefits, and alternatives have been discussed with the patient in detail. The patient states understanding and desires to proceed.  The patient is currently on tramadol, Paxil, Norco. No anticoagulants. Given this and her chronic and cannaboid use we will plan for the procedure on propofol/MAC to ensure adequate sedation.

## 2016-04-16 NOTE — Patient Instructions (Signed)
Donna Ramirez  04/16/2016     @PREFPERIOPPHARMACY @   Your procedure is scheduled on 04/26/2016.  Report to [facility] at 8:45 A.M.  Call this number if you have problems the morning of surgery:  [phone]   Remember:  Do not eat food or drink liquids after midnight.  Take these medicines the morning of surgery with A SIP OF WATER :PAXIL, ULTRAM AND ZOFRAN   Do not wear jewelry, make-up or nail polish.  Do not wear lotions, powders, or perfumes.  You may wear deodorant.  Do not shave 48 hours prior to surgery.  Men may shave face and neck.  Do not bring valuables to the hospital.  [facility] is not responsible for any belongings or valuables.  Contacts, dentures or bridgework may not be worn into surgery.  Leave your suitcase in the car.  After surgery it may be brought to your room.  For patients admitted to the hospital, discharge time will be determined by your treatment team.  Patients discharged the day of surgery will not be allowed to drive home.   Name and phone number of your driver:   FAMILY Special instructions:  N/A  Please read over the following fact sheets that you were given. Care and Recovery After Surgery    Esophagogastroduodenoscopy Esophagogastroduodenoscopy (EGD) is a procedure that is used to examine the lining of the esophagus, stomach, and first part of the small intestine (duodenum). A long, flexible, lighted tube with a camera attached (endoscope) is inserted down the throat to view these organs. This procedure is done to detect problems or abnormalities, such as inflammation, bleeding, ulcers, or growths, in order to treat them. The procedure lasts 5-20 minutes. It is usually an outpatient procedure, but it may need to be performed in a hospital in emergency cases. LET [facility] CARE PROVIDER KNOW ABOUT:  Any allergies you have.  All medicines you are taking, including vitamins, herbs, eye drops, creams, and over-the-counter  medicines.  Previous problems you or members of your family have had with the use of anesthetics.  Any blood disorders you have.  Previous surgeries you have had.  Medical conditions you have. RISKS AND COMPLICATIONS Generally, this is a safe procedure. However, problems can occur and include:  Infection.  Bleeding.  Tearing (perforation) of the esophagus, stomach, or duodenum.  Difficulty breathing or not being able to breathe.  Excessive sweating.  Spasms of the larynx.  Slowed heartbeat.  Low blood pressure. BEFORE THE PROCEDURE  Do not eat or drink anything after midnight on the night before the procedure or as directed by your health care provider.  Do not take your regular medicines before the procedure if your health care provider asks you not to. Ask your health care provider about changing or stopping those medicines.  If you wear dentures, be prepared to remove them before the procedure.  Arrange for someone to drive you home after the procedure. PROCEDURE  A numbing medicine (local anesthetic) may be sprayed in your throat for comfort and to stop you from gagging or coughing.  You will have an IV tube inserted in a vein in your hand or arm. You will receive medicines and fluids through this tube.  You will be given a medicine to relax you (sedative).  A pain reliever will be given through the IV tube.  A mouth guard may be placed in your mouth to protect your teeth and to keep you from biting on the endoscope.  You will be asked to lie on your left side.  The endoscope will be inserted down your throat and into your esophagus, stomach, and duodenum.  Air will be put through the endoscope to allow your health care provider to clearly view the lining of your esophagus.  The lining of your esophagus, stomach, and duodenum will be examined. During the exam, your health care provider may:  Remove tissue to be examined under a microscope (biopsy) for  inflammation, infection, or other medical problems.  Remove growths.  Remove objects (foreign bodies) that are stuck.  Treat any bleeding with medicines or other devices that stop tissues from bleeding (hot cautery, clipping devices).  Widen (dilate) or stretch narrowed areas of your esophagus and stomach.  The endoscope will be withdrawn. AFTER THE PROCEDURE  You will be taken to a recovery area for observation. Your blood pressure, heart rate, breathing rate, and blood oxygen level will be monitored often until the medicines you were given have worn off.  Do not eat or drink anything until the numbing medicine has worn off and your gag reflex has returned. You may choke.  Your health care provider should be able to discuss his or her findings with you. It will take longer to discuss the test results if any biopsies were taken.   This information is not intended to replace advice given to you by your health care provider. Make sure you discuss any questions you have with your health care provider.   Document Released: 03/25/2005 Document Revised: 12/13/2014 Document Reviewed: 10/25/2012 Elsevier Interactive Patient Education 2016 [employer]. Colonoscopy A colonoscopy is an exam to look at the entire large intestine (colon). This exam can help find problems such as tumors, polyps, inflammation, and areas of bleeding. The exam takes about 1 hour.  LET [facility] CARE PROVIDER KNOW ABOUT:   Any allergies you have.  All medicines you are taking, including vitamins, herbs, eye drops, creams, and over-the-counter medicines.  Previous problems you or members of your family have had with the use of anesthetics.  Any blood disorders you have.  Previous surgeries you have had.  Medical conditions you have. RISKS AND COMPLICATIONS  Generally, this is a safe procedure. However, as with any procedure, complications can occur. Possible complications include:  Bleeding.  Tearing or  rupture of the colon wall.  Reaction to medicines given during the exam.  Infection (rare). BEFORE THE PROCEDURE   Ask your health care provider about changing or stopping your regular medicines.  You may be prescribed an oral bowel prep. This involves drinking a large amount of medicated liquid, starting the day before your procedure. The liquid will cause you to have multiple loose stools until your stool is almost clear or light green. This cleans out your colon in preparation for the procedure.  Do not eat or drink anything else once you have started the bowel prep, unless your health care provider tells you it is safe to do so.  Arrange for someone to drive you home after the procedure. PROCEDURE   You will be given medicine to help you relax (sedative).  You will lie on your side with your knees bent.  A long, flexible tube with a light and camera on the end (colonoscope) will be inserted through the rectum and into the colon. The camera sends video back to a computer screen as it moves through the colon. The colonoscope also releases carbon dioxide gas to inflate the colon. This helps your health  care provider see the area better.  During the exam, your health care provider may take a small tissue sample (biopsy) to be examined under a microscope if any abnormalities are found.  The exam is finished when the entire colon has been viewed. AFTER THE PROCEDURE   Do not drive for 24 hours after the exam.  You may have a small amount of blood in your stool.  You may pass moderate amounts of gas and have mild abdominal cramping or bloating. This is caused by the gas used to inflate your colon during the exam.  Ask when your test results will be ready and how you will get your results. Make sure you get your test results.   This information is not intended to replace advice given to you by your health care provider. Make sure you discuss any questions you have with your health care  provider.   Document Released: 11/19/2000 Document Revised: 09/12/2013 Document Reviewed: 07/30/2013 Elsevier Interactive Patient Education [employer].

## 2016-04-19 ENCOUNTER — Encounter (HOSPITAL_COMMUNITY): Payer: Self-pay

## 2016-04-19 ENCOUNTER — Encounter (HOSPITAL_COMMUNITY)
Admission: RE | Admit: 2016-04-19 | Discharge: 2016-04-19 | Disposition: A | Discharge status: Home / Self Care | Payer: Medicare Other | Source: Ambulatory Visit | Attending: Internal Medicine | Admitting: Internal Medicine

## 2016-04-19 DIAGNOSIS — R112 Nausea with vomiting, unspecified: Secondary | ICD-10-CM | POA: Insufficient documentation

## 2016-04-19 DIAGNOSIS — R1310 Dysphagia, unspecified: Secondary | ICD-10-CM | POA: Diagnosis present

## 2016-04-19 DIAGNOSIS — K921 Melena: Secondary | ICD-10-CM | POA: Diagnosis not present

## 2016-04-19 DIAGNOSIS — C8590 Non-Hodgkin lymphoma, unspecified, unspecified site: Secondary | ICD-10-CM | POA: Insufficient documentation

## 2016-04-19 DIAGNOSIS — Z01812 Encounter for preprocedural laboratory examination: Secondary | ICD-10-CM | POA: Insufficient documentation

## 2016-04-19 DIAGNOSIS — R1013 Epigastric pain: Secondary | ICD-10-CM | POA: Insufficient documentation

## 2016-04-19 HISTORY — DX: Depression, unspecified: F32.A

## 2016-04-19 HISTORY — DX: Major depressive disorder, single episode, unspecified: F32.9

## 2016-04-19 HISTORY — DX: Anxiety disorder, unspecified: F41.9

## 2016-04-19 LAB — CBC WITH DIFFERENTIAL/PLATELET
Basophils Absolute: 0 10*3/uL (ref 0.0–0.1)
Basophils Relative: 1 %
EOS ABS: 0.2 10*3/uL (ref 0.0–0.7)
EOS PCT: 4 %
HCT: 46.5 % — ABNORMAL HIGH (ref 36.0–46.0)
Hemoglobin: 14.8 g/dL (ref 12.0–15.0)
LYMPHS ABS: 1.9 10*3/uL (ref 0.7–4.0)
Lymphocytes Relative: 38 %
MCH: 29.6 pg (ref 26.0–34.0)
MCHC: 31.8 g/dL (ref 30.0–36.0)
MCV: 93 fL (ref 78.0–100.0)
Monocytes Absolute: 0.3 10*3/uL (ref 0.1–1.0)
Monocytes Relative: 6 %
Neutro Abs: 2.6 10*3/uL (ref 1.7–7.7)
Neutrophils Relative %: 51 %
PLATELETS: 210 10*3/uL (ref 150–400)
RBC: 5 MIL/uL (ref 3.87–5.11)
RDW: 13.8 % (ref 11.5–15.5)
WBC: 5 10*3/uL (ref 4.0–10.5)

## 2016-04-19 LAB — BASIC METABOLIC PANEL
Anion gap: 8 (ref 5–15)
BUN: 14 mg/dL (ref 6–20)
CALCIUM: 9.7 mg/dL (ref 8.9–10.3)
CHLORIDE: 104 mmol/L (ref 101–111)
CO2: 26 mmol/L (ref 22–32)
CREATININE: 0.65 mg/dL (ref 0.44–1.00)
GFR calc non Af Amer: >60 mL/min (ref >60)
GLUCOSE: 84 mg/dL (ref 65–99)
Potassium: 3.9 mmol/L (ref 3.5–5.1)
SODIUM: 138 mmol/L (ref 135–145)

## 2016-04-19 NOTE — Pre-Procedure Instructions (Signed)
Patient given information to sign up for my chart at home. 

## 2016-04-26 ENCOUNTER — Ambulatory Visit (HOSPITAL_COMMUNITY)
Admission: RE | Admit: 2016-04-26 | Discharge: 2016-04-26 | Disposition: A | Discharge status: Home / Self Care | Payer: Medicare Other | Source: Ambulatory Visit | Attending: Internal Medicine | Admitting: Internal Medicine

## 2016-04-26 ENCOUNTER — Ambulatory Visit (HOSPITAL_COMMUNITY): Payer: Medicare Other | Admitting: Anesthesiology

## 2016-04-26 ENCOUNTER — Encounter (HOSPITAL_COMMUNITY)
Admission: RE | Disposition: A | Discharge status: Home / Self Care | Payer: Self-pay | Source: Ambulatory Visit | Attending: Internal Medicine

## 2016-04-26 DIAGNOSIS — R1310 Dysphagia, unspecified: Secondary | ICD-10-CM | POA: Diagnosis present

## 2016-04-26 DIAGNOSIS — Z803 Family history of malignant neoplasm of breast: Secondary | ICD-10-CM | POA: Diagnosis not present

## 2016-04-26 DIAGNOSIS — K642 Third degree hemorrhoids: Secondary | ICD-10-CM | POA: Diagnosis not present

## 2016-04-26 DIAGNOSIS — K921 Melena: Secondary | ICD-10-CM | POA: Diagnosis not present

## 2016-04-26 DIAGNOSIS — F17210 Nicotine dependence, cigarettes, uncomplicated: Secondary | ICD-10-CM | POA: Diagnosis not present

## 2016-04-26 DIAGNOSIS — K449 Diaphragmatic hernia without obstruction or gangrene: Secondary | ICD-10-CM | POA: Diagnosis not present

## 2016-04-26 DIAGNOSIS — R12 Heartburn: Secondary | ICD-10-CM | POA: Diagnosis not present

## 2016-04-26 DIAGNOSIS — Z79899 Other long term (current) drug therapy: Secondary | ICD-10-CM | POA: Insufficient documentation

## 2016-04-26 DIAGNOSIS — K222 Esophageal obstruction: Secondary | ICD-10-CM | POA: Insufficient documentation

## 2016-04-26 DIAGNOSIS — G8929 Other chronic pain: Secondary | ICD-10-CM | POA: Diagnosis not present

## 2016-04-26 DIAGNOSIS — Z8572 Personal history of non-Hodgkin lymphomas: Secondary | ICD-10-CM | POA: Diagnosis not present

## 2016-04-26 HISTORY — PX: MALONEY DILATION: SHX5535

## 2016-04-26 HISTORY — PX: COLONOSCOPY WITH PROPOFOL: SHX5780

## 2016-04-26 HISTORY — PX: ESOPHAGOGASTRODUODENOSCOPY (EGD) WITH PROPOFOL: SHX5813

## 2016-04-26 SURGERY — COLONOSCOPY WITH PROPOFOL
Anesthesia: Monitor Anesthesia Care

## 2016-04-26 MED ORDER — MIDAZOLAM HCL 5 MG/5ML IJ SOLN
INTRAMUSCULAR | Status: DC | PRN
Start: 2016-04-26 — End: 2016-04-26
  Administered 2016-04-26: 2 mg via INTRAVENOUS

## 2016-04-26 MED ORDER — LACTATED RINGERS IV SOLN
INTRAVENOUS | Status: DC
  Administered 2016-04-26: 1000 mL via INTRAVENOUS

## 2016-04-26 MED ORDER — FENTANYL CITRATE (PF) 100 MCG/2ML IJ SOLN
25.0000 ug | INTRAMUSCULAR | Status: AC
  Administered 2016-04-26 (×2): 25 ug via INTRAVENOUS

## 2016-04-26 MED ORDER — GLYCOPYRROLATE 0.2 MG/ML IJ SOLN
0.2000 mg | Freq: Once | INTRAMUSCULAR | Status: AC
  Administered 2016-04-26: 0.2 mg via INTRAVENOUS

## 2016-04-26 MED ORDER — LIDOCAINE VISCOUS 2 % MT SOLN
5.0000 mL | Freq: Two times a day (BID) | OROMUCOSAL | Status: AC
  Administered 2016-04-26 (×2): 5 mL via OROMUCOSAL

## 2016-04-26 MED ORDER — ONDANSETRON HCL 4 MG/2ML IJ SOLN
4.0000 mg | Freq: Once | INTRAMUSCULAR | Status: AC
Start: 2016-04-26 — End: 2016-04-26
  Administered 2016-04-26: 4 mg via INTRAVENOUS

## 2016-04-26 MED ORDER — MIDAZOLAM HCL 2 MG/2ML IJ SOLN
1.0000 mg | INTRAMUSCULAR | Status: DC | PRN
Start: 2016-04-26 — End: 2016-04-26
  Administered 2016-04-26: 2 mg via INTRAVENOUS

## 2016-04-26 MED ORDER — GLYCOPYRROLATE 0.2 MG/ML IJ SOLN
INTRAMUSCULAR | Status: AC
  Filled 2016-04-26: qty 1

## 2016-04-26 MED ORDER — MIDAZOLAM HCL 2 MG/2ML IJ SOLN
INTRAMUSCULAR | Status: AC
  Filled 2016-04-26: qty 2

## 2016-04-26 MED ORDER — PROPOFOL 10 MG/ML IV BOLUS
INTRAVENOUS | Status: AC
  Filled 2016-04-26: qty 40

## 2016-04-26 MED ORDER — ONDANSETRON HCL 4 MG/2ML IJ SOLN: 4.0000 mg | Freq: Once | INTRAMUSCULAR | Status: DC | PRN

## 2016-04-26 MED ORDER — ONDANSETRON HCL 4 MG/2ML IJ SOLN
INTRAMUSCULAR | Status: AC
  Filled 2016-04-26: qty 2

## 2016-04-26 MED ORDER — LIDOCAINE VISCOUS 2 % MT SOLN
OROMUCOSAL | Status: AC
  Filled 2016-04-26: qty 15

## 2016-04-26 MED ORDER — FENTANYL CITRATE (PF) 100 MCG/2ML IJ SOLN: 25.0000 ug | INTRAMUSCULAR | Status: DC | PRN

## 2016-04-26 MED ORDER — PROPOFOL 10 MG/ML IV BOLUS
INTRAVENOUS | Status: AC
  Filled 2016-04-26: qty 20

## 2016-04-26 MED ORDER — PROPOFOL 500 MG/50ML IV EMUL
INTRAVENOUS | Status: DC | PRN
  Administered 2016-04-26: 10:00:00 via INTRAVENOUS
  Administered 2016-04-26: 125 ug/kg/min via INTRAVENOUS

## 2016-04-26 MED ORDER — FENTANYL CITRATE (PF) 100 MCG/2ML IJ SOLN
INTRAMUSCULAR | Status: AC
  Filled 2016-04-26: qty 2

## 2016-04-26 NOTE — Interval H&P Note (Signed)
History and Physical Interval Note:  04/26/2016 9:21 AM  Donna Ramirez  has presented today for surgery, with the diagnosis of dysphagia/nausea/dyspepsia/hematochezia  The various methods of treatment have been discussed with the patient and family. After consideration of risks, benefits and other options for treatment, the patient has consented to  Procedure(s) with comments: COLONOSCOPY WITH PROPOFOL (N/A) - 1015 - moved to 9:30 - office to notify ESOPHAGOGASTRODUODENOSCOPY (EGD) WITH PROPOFOL (N/A) MALONEY DILATION (N/A) as a surgical intervention .  The patient's history has been reviewed, patient examined, no change in status, stable for surgery.  I have reviewed the patient's chart and labs.  Questions were answered to the patient's satisfaction.     Bill Mcvey  No change. EGD/ED and diagnostic colonoscopy per plan.  The risks, benefits, limitations, imponderables and alternatives regarding both EGD and colonoscopy have been reviewed with the patient. Questions have been answered. All parties agreeable.

## 2016-04-26 NOTE — Discharge Instructions (Signed)
Colonoscopy Discharge Instructions  Read the instructions outlined below and refer to this sheet in the next few weeks. These discharge instructions provide you with general information on caring for yourself after you leave the hospital. Your doctor may also give you specific instructions. While your treatment has been planned according to the most current medical practices available, unavoidable complications occasionally occur. If you have any problems or questions after discharge, call Dr. Gala Romney at [phone]. ACTIVITY  You may resume your regular activity, but move at a slower pace for the next 24 hours.   Take frequent rest periods for the next 24 hours.   Walking will help get rid of the air and reduce the bloated feeling in your belly (abdomen).   No driving for 24 hours (because of the medicine (anesthesia) used during the test).    Do not sign any important legal documents or operate any machinery for 24 hours (because of the anesthesia used during the test).  NUTRITION  Drink plenty of fluids.   You may resume your normal diet as instructed by your doctor.   Begin with a light meal and progress to your normal diet. Heavy or fried foods are harder to digest and may make you feel sick to your stomach (nauseated).   Avoid alcoholic beverages for 24 hours or as instructed.  MEDICATIONS  You may resume your normal medications unless your doctor tells you otherwise.  WHAT YOU CAN EXPECT TODAY  Some feelings of bloating in the abdomen.   Passage of more gas than usual.   Spotting of blood in your stool or on the toilet paper.  IF YOU HAD POLYPS REMOVED DURING THE COLONOSCOPY:  No aspirin products for 7 days or as instructed.   No alcohol for 7 days or as instructed.   Eat a soft diet for the next 24 hours.  FINDING OUT THE RESULTS OF YOUR TEST Not all test results are available during your visit. If your test results are not back during the visit, make an appointment  with your caregiver to find out the results. Do not assume everything is normal if you have not heard from your caregiver or the medical facility. It is important for you to follow up on all of your test results.  SEEK IMMEDIATE MEDICAL ATTENTION IF:  You have more than a spotting of blood in your stool.   Your belly is swollen (abdominal distention).   You are nauseated or vomiting.   You have a temperature over 101.   You have abdominal pain or discomfort that is severe or gets worse throughout the day. The risks, benefits, limitations, alternatives and imponderables have been reviewed with the patient. Potential for esophageal dilation, biopsy, etc. have also been reviewed.  Questions have been answered. All parties agreeable.   GERD and hemorrhoid information provided  Begin  Protonix 40 mg daily  Pamphlet on hemorrhoid banding provided  Office visit with [place] in 6 weeks  Hemorrhoids Hemorrhoids are swollen veins around the rectum or anus. There are two types of hemorrhoids:   Internal hemorrhoids. These occur in the veins just inside the rectum. They may poke through to the outside and become irritated and painful.  External hemorrhoids. These occur in the veins outside the anus and can be felt as a painful swelling or hard lump near the anus. CAUSES  Pregnancy.   Obesity.   Constipation or diarrhea.   Straining to have a bowel movement.   Sitting for long periods on the  toilet.  Heavy lifting or other activity that caused you to strain.  Anal intercourse. SYMPTOMS   Pain.   Anal itching or irritation.   Rectal bleeding.   Fecal leakage.   Anal swelling.   One or more lumps around the anus.  DIAGNOSIS  Your caregiver may be able to diagnose hemorrhoids by visual examination. Other examinations or tests that may be performed include:   Examination of the rectal area with a gloved hand (digital rectal exam).   Examination of anal canal using a  small tube (scope).   A blood test if you have lost a significant amount of blood.  A test to look inside the colon (sigmoidoscopy or colonoscopy). TREATMENT Most hemorrhoids can be treated at home. However, if symptoms do not seem to be getting better or if you have a lot of rectal bleeding, your caregiver may perform a procedure to help make the hemorrhoids get smaller or remove them completely. Possible treatments include:   Placing a rubber band at the base of the hemorrhoid to cut off the circulation (rubber band ligation).   Injecting a chemical to shrink the hemorrhoid (sclerotherapy).   Using a tool to burn the hemorrhoid (infrared light therapy).   Surgically removing the hemorrhoid (hemorrhoidectomy).   Stapling the hemorrhoid to block blood flow to the tissue (hemorrhoid stapling).  HOME CARE INSTRUCTIONS   Eat foods with fiber, such as whole grains, beans, nuts, fruits, and vegetables. Ask your doctor about taking products with added fiber in them (fibersupplements).  Increase fluid intake. Drink enough water and fluids to keep your urine clear or pale yellow.   Exercise regularly.   Go to the bathroom when you have the urge to have a bowel movement. Do not wait.   Avoid straining to have bowel movements.   Keep the anal area dry and clean. Use wet toilet paper or moist towelettes after a bowel movement.   Medicated creams and suppositories may be used or applied as directed.   Only take over-the-counter or prescription medicines as directed by your caregiver.   Take warm sitz baths for 15-20 minutes, 3-4 times a day to ease pain and discomfort.   Place ice packs on the hemorrhoids if they are tender and swollen. Using ice packs between sitz baths may be helpful.   Put ice in a plastic bag.   Place a towel between your skin and the bag.   Leave the ice on for 15-20 minutes, 3-4 times a day.   Do not use a donut-shaped pillow or sit on the  toilet for long periods. This increases blood pooling and pain.  SEEK MEDICAL CARE IF:  You have increasing pain and swelling that is not controlled by treatment or medicine.  You have uncontrolled bleeding.  You have difficulty or you are unable to have a bowel movement.  You have pain or inflammation outside the area of the hemorrhoids. MAKE SURE YOU:  Understand these instructions.  Will watch your condition.  Will get help right away if you are not doing well or get worse.   This information is not intended to replace advice given to you by your health care provider. Make sure you discuss any questions you have with your health care provider.   Document Released: 11/19/2000 Document Revised: 11/08/2012 Document Reviewed: 09/26/2012 Elsevier Interactive Patient Education 2016 [place]. Gastroesophageal Reflux Disease, Adult Normally, food travels down the esophagus and stays in the stomach to be digested. However, when a person has  gastroesophageal reflux disease (GERD), food and stomach acid move back up into the esophagus. When this happens, the esophagus becomes sore and inflamed. Over time, GERD can create small holes (ulcers) in the lining of the esophagus.  CAUSES This condition is caused by a problem with the muscle between the esophagus and the stomach (lower esophageal sphincter, or LES). Normally, the LES muscle closes after food passes through the esophagus to the stomach. When the LES is weakened or abnormal, it does not close properly, and that allows food and stomach acid to go back up into the esophagus. The LES can be weakened by certain dietary substances, medicines, and medical conditions, including:  Tobacco use.  Pregnancy.  Having a hiatal hernia.  Heavy alcohol use.  Certain foods and beverages, such as coffee, chocolate, onions, and peppermint. RISK FACTORS This condition is more likely to develop in:  People who have an increased body  weight.  People who have connective tissue disorders.  People who use NSAID medicines. SYMPTOMS Symptoms of this condition include:  Heartburn.  Difficult or painful swallowing.  The feeling of having a lump in the throat.  Abitter taste in the mouth.  Bad breath.  Having a large amount of saliva.  Having an upset or bloated stomach.  Belching.  Chest pain.  Shortness of breath or wheezing.  Ongoing (chronic) cough or a night-time cough.  Wearing away of tooth enamel.  Weight loss. Different conditions can cause chest pain. Make sure to see your health care provider if you experience chest pain. DIAGNOSIS Your health care provider will take a medical history and perform a physical exam. To determine if you have mild or severe GERD, your health care provider may also monitor how you respond to treatment. You may also have other tests, including:  An endoscopy toexamine your stomach and esophagus with a small camera.  A test thatmeasures the acidity level in your esophagus.  A test thatmeasures how much pressure is on your esophagus.  A barium swallow or modified barium swallow to show the shape, size, and functioning of your esophagus. TREATMENT The goal of treatment is to help relieve your symptoms and to prevent complications. Treatment for this condition may vary depending on how severe your symptoms are. Your health care provider may recommend:  Changes to your diet.  Medicine.  Surgery. HOME CARE INSTRUCTIONS Diet  Follow a diet as recommended by your health care provider. This may involve avoiding foods and drinks such as:  Coffee and tea (with or without caffeine).  Drinks that containalcohol.  Energy drinks and sports drinks.  Carbonated drinks or sodas.  Chocolate and cocoa.  Peppermint and mint flavorings.  Garlic and onions.  Horseradish.  Spicy and acidic foods, including peppers, chili powder, curry powder, vinegar, hot sauces,  and barbecue sauce.  Citrus fruit juices and citrus fruits, such as oranges, lemons, and limes.  Tomato-based foods, such as red sauce, chili, salsa, and pizza with red sauce.  Fried and fatty foods, such as donuts, french fries, potato chips, and high-fat dressings.  High-fat meats, such as hot dogs and fatty cuts of red and white meats, such as rib eye steak, sausage, ham, and bacon.  High-fat dairy items, such as whole milk, butter, and cream cheese.  Eat small, frequent meals instead of large meals.  Avoid drinking large amounts of liquid with your meals.  Avoid eating meals during the 2-3 hours before bedtime.  Avoid lying down right after you eat.  Do not  exercise right after you eat. General Instructions  Pay attention to any changes in your symptoms.  Take over-the-counter and prescription medicines only as told by your health care provider. Do not take aspirin, ibuprofen, or other NSAIDs unless your health care provider told you to do so.  Do not use any tobacco products, including cigarettes, chewing tobacco, and e-cigarettes. If you need help quitting, ask your health care provider.  Wear loose-fitting clothing. Do not wear anything tight around your waist that causes pressure on your abdomen.  Raise (elevate) the head of your bed 6 inches (15cm).  Try to reduce your stress, such as with yoga or meditation. If you need help reducing stress, ask your health care provider.  If you are overweight, reduce your weight to an amount that is healthy for you. Ask your health care provider for guidance about a safe weight loss goal.  Keep all follow-up visits as told by your health care provider. This is important. SEEK MEDICAL CARE IF:  You have new symptoms.  You have unexplained weight loss.  You have difficulty swallowing, or it hurts to swallow.  You have wheezing or a persistent cough.  Your symptoms do not improve with treatment.  You have a hoarse  voice. SEEK IMMEDIATE MEDICAL CARE IF:  You have pain in your arms, neck, jaw, teeth, or back.  You feel sweaty, dizzy, or light-headed.  You have chest pain or shortness of breath.  You vomit and your vomit looks like blood or coffee grounds.  You faint.  Your stool is bloody or black.  You cannot swallow, drink, or eat.   This information is not intended to replace advice given to you by your health care provider. Make sure you discuss any questions you have with your health care provider.   Document Released: 09/01/2005 Document Revised: 08/13/2015 Document Reviewed: 03/19/2015 Elsevier Interactive Patient Education [employer].

## 2016-04-26 NOTE — Anesthesia Preprocedure Evaluation (Addendum)
Anesthesia Evaluation  Patient identified by MRN, date of birth, ID band Patient awake    Reviewed: Allergy & Precautions, NPO status , Patient's Chart, lab work & pertinent test results  Airway Mallampati: I  TM Distance: >3 FB Neck ROM: Full    Dental  (+) Teeth Intact   Pulmonary Current Smoker,    breath sounds clear to auscultation       Cardiovascular negative cardio ROS   Rhythm:Regular Rate:Normal     Neuro/Psych PSYCHIATRIC DISORDERS Anxiety Depression    GI/Hepatic GERD  ,  Endo/Other    Renal/GU      Musculoskeletal   Abdominal   Peds  Hematology  (+) Blood dyscrasia (Non Hodgkin's lymphoma in remission), ,   Anesthesia Other Findings Chronic pain   Reproductive/Obstetrics                            Anesthesia Physical Anesthesia Plan  ASA: III  Anesthesia Plan: MAC   Post-op Pain Management:    Induction: Intravenous  Airway Management Planned: Simple Face Mask  Additional Equipment:   Intra-op Plan:   Post-operative Plan:   Informed Consent: I have reviewed the patients History and Physical, chart, labs and discussed the procedure including the risks, benefits and alternatives for the proposed anesthesia with the patient or authorized representative who has indicated his/her understanding and acceptance.     Plan Discussed with:   Anesthesia Plan Comments:         Anesthesia Quick Evaluation

## 2016-04-26 NOTE — H&P (View-Only) (Signed)
Primary Care Physician:  Ardine Eng, MD Primary Gastroenterologist:  Dr. Gala Romney  Chief Complaint  Patient presents with  . set up EGD    HPI:   Donna Ramirez is a 45 y.o. female who presents on referral from [facility] for upper endoscopy. PCP notes reviewed, last saw PCP on 03/04/2016. Notes patient has had nausea for approximately 2 months with all foods making her nauseated, no vomiting or diarrhea, some periumbilical discomfort without reflux, sour taste, heartburn symptoms. No prior history of H. pylori. 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 Previously smoked marijuana a few nights a week but based on visit notes patient has quit smoking tobacco and marijuana.  She previously had extensive evaluation for chronic abdominal pain by [place] this included a colonoscopy dated 07/30/2010 which found rectosigmoid polyps removed, otherwise normal. Surgical pathology found the polyps to be hyperplastic. This was followed by an upper endoscopy on 08/19/2010 which found noncritical Schatzki's ring, otherwise normal esophagus, small gastric erosions, patent pylorus, subtle abnormality of the area of the ampulla of uncertain significance status post biopsy. Surgical pathology found the ampullary biopsy to be benign duodenal mucosa with gastric metaplasia with noted features suggested of peptic duodenitis. She did have a follow-up endoscopic ultrasound related to the abnormal ampulla which found correlation with an anechoic, round 6 mm lesion in the mucosal layer of the duodenal wall not concerning for neoplasm and may be small duplication cyst. Noted limited views of pancreas, liver, spleen, portal vessels all noted as normal.  Today she states her nausea is improved, Zofran helps. Nausea started a couple months ago.  Some vomiting ("not a lot, but some"). Last episode of emesis about 3-4 weeks ago. No hematemesis. Was also recently seen in the ER for abdominal pain and hematochezia. Labs and XRays were unremarkable. Abdominal pain continues unless she takes Ultram, typically periumbilical, described as an ache. No relation between abdominal pain and nausea. Nausea triggered by food of all types. Also has some epigastric discomfort, denies esophageal burning. When belching has some regurgitation, also with some dysphagia about once a week. Denies melena. Denies fever, chills, unintentional weight loss. Denies chest pain, dyspnea, dizziness, lightheadedness, syncope, near syncope. Denies any other upper or lower GI symptoms.  Past Medical History  Diagnosis Date  . Non Hodgkin's lymphoma ([place]) 2011  . DDD (degenerative disc disease)   . Abdominal wall pain   . Hearing loss   . Fatty liver   . Fibroid, uterine   . Marijuana abuse   . Chronic back pain     Past Surgical History  Procedure Laterality Date  . Abdominal hysterectomy  2008    complete  . Lymphadenectomy  2011  . Esophagogastroduodenoscopy  08/19/10    Rourk-noncritical schatzki ring, abnormal and pillow   . Colonoscopy  07/31/10    Rourk-hyperplastic polyp  . Esophagogastroduodenoscopy  04/2008    Rourk-mild erosive reflux esophagitis, noncritical Schatzki's ring status post dilation, 1.5 cm pedunculated mass distal D2  . Eus  6/09    Jacobs-6-8 MM nodule seen with recent mucosal biopsy 2-3 cm distal to the major papilla, biopsy benign, 1 cm or adenomatous-appearing mucus at the major papilla biopsy (focal mild inflammation associated with slight atypia)  . Esophagogastroduodenoscopy  10/09    [facility]-normal    Current Outpatient Prescriptions  Medication Sig Dispense Refill  . ondansetron (ZOFRAN ODT) 4 MG disintegrating tablet 4mg  ODT q4 hours prn nausea/vomit 12 tablet 0  . PARoxetine (PAXIL) 40 MG tablet Take 40 mg by mouth every morning.     . traMADol (ULTRAM) 50 MG tablet Take by mouth every 12 (twelve) hours as needed.    Marland Kitchen HYDROcodone-acetaminophen (NORCO/VICODIN) 5-325 MG tablet Take 1 tablet by mouth every 4 (four) hours as needed for moderate pain (Must last 30 days.  Do not take and drive a car or use machinery.). (Patient not taking: Reported on 04/01/2016) 120 tablet 0   No current facility-administered medications for this visit.    Allergies as of 04/01/2016  . (No Known Allergies)    Family History  Problem Relation Age of Onset  . Diabetes Mother   . Breast cancer Mother   . Hypertension Other   . Hyperlipidemia Other   . Cancer Other   . Colon polyps Father 108    Social History   Social History  . Marital Status: Single    Spouse Name: N/A  . Number of Children: 0  . Years of Education: N/A   Occupational History  . disabled    Social History Main Topics  . Smoking status: Current Every Day Smoker -- 0.20 packs/day for 25 years    Types: Cigarettes  . Smokeless tobacco: Never Used  . Alcohol Use: 0.0 oz/week    0 Standard drinks or equivalent per week     Comment: rarely couple times per yr  . Drug Use: Yes    Special: Marijuana     Comment: no marijuana in 4-5 mo  . Sexual Activity: No   Other Topics Concern  . Not on file   Social History Narrative    Review of Systems: General: Negative for anorexia, weight loss, fever, chills, fatigue, weakness. ENT: Negative for hoarseness, difficulty swallowing. CV: Negative for chest pain, angina, palpitations, peripheral edema.  Respiratory: Negative for dyspnea at rest, cough, sputum, wheezing.  GI: See history of present illness. MS: Chronic pain.  Derm: Negative for rash or itching.  Endo: Negative for unusual weight change.  Heme: Negative for bruising or bleeding. Allergy: Negative for rash or hives.    Physical Exam: BP 137/89 mmHg  Pulse 86  Temp(Src) 96.7 F (35.9 C)  Ht 5\' 5"  (1.651 m)  Wt 188 lb 3.2 oz (85.367 kg)   BMI 31.32 kg/m2 General:   Alert and oriented. Pleasant and cooperative. Well-nourished and well-developed.  Head:  Normocephalic and atraumatic. Eyes:  Without icterus, sclera clear and conjunctiva pink.  Ears:  Normal auditory acuity. Cardiovascular:  S1, S2 present without murmurs appreciated. Extremities without clubbing or edema. Respiratory:  Clear to auscultation bilaterally. No wheezes, rales, or rhonchi. No distress.  Gastrointestinal:  +BS, soft, and non-distended. Mild periumbilical/mid-abdominal pain. No HSM noted. No guarding or rebound. No masses appreciated.  Rectal:  Deferred  Musculoskalatal:  Symmetrical without gross deformities Skin:  Intact without significant lesions or rashes. Neurologic:  Alert and oriented x4;  grossly normal neurologically. Psych:  Alert and cooperative. Normal mood and affect. Heme/Lymph/Immune: No excessive bruising noted.    04/01/2016 8:44 AM   Disclaimer: This note was dictated with voice recognition software. Similar sounding words can inadvertently be transcribed and may not be corrected upon review.

## 2016-04-26 NOTE — Op Note (Signed)
[facility] Patient Name: Donna Ramirez Procedure Date: 04/26/2016 9:46 AM MRN: [id number] Date of Birth: 1971-10-08 Attending MD: Norvel Richards , MD CSN: [id number] Age: 45 Admit Type: Outpatient Procedure:                Colonoscopy - diagnostic Indications:              Hematochezia Providers:                Norvel Richards, MD, Lurline Del, RN, Isabella Stalling, Technician Referring MD:              Medicines:                Monitored Anesthesia Care Complications:            No immediate complications. Estimated Blood Loss:     Estimated blood loss: none. Procedure:                Pre-Anesthesia Assessment:                           - Prior to the procedure, a History and Physical                            was performed, and patient medications and                            allergies were reviewed. The patient's tolerance of                            previous anesthesia was also reviewed. The risks                            and benefits of the procedure and the sedation                            options and risks were discussed with the patient.                            All questions were answered, and informed consent                            was obtained. Prior Anticoagulants: The patient has                            taken no previous anticoagulant or antiplatelet                            agents. ASA Grade Assessment: II - A patient with                            mild systemic disease. After reviewing the risks  and benefits, the patient was deemed in                            satisfactory condition to undergo the procedure.                           - Prior to the procedure, a History and Physical                            was performed, and patient medications and                            allergies were reviewed. The patient's tolerance of                            previous anesthesia was  also reviewed. The risks                            and benefits of the procedure and the sedation                            options and risks were discussed with the patient.                            All questions were answered, and informed consent                            was obtained. Prior Anticoagulants: The patient has                            taken no previous anticoagulant or antiplatelet                            agents. ASA Grade Assessment: II - A patient with                            mild systemic disease. After reviewing the risks                            and benefits, the patient was deemed in                            satisfactory condition to undergo the procedure.                           After obtaining informed consent, the colonoscope                            was passed under direct vision. Throughout the                            procedure, the patient's blood pressure, pulse, and  oxygen saturations were monitored continuously. The                            [id number]) scope was introduced through                            the anus and advanced to the the cecum, identified                            by appendiceal orifice and ileocecal valve. The                            colonoscopy was performed without difficulty. The                            patient tolerated the procedure well. The quality                            of the bowel preparation was adequate. The entire                            colon was well visualized. The ileocecal valve,                            appendiceal orifice, and rectum were photographed. Scope In: 9:52:18 AM Scope Out: 10:06:52 AM Scope Withdrawal Time: 0 hours 7 minutes 16 seconds  Total Procedure Duration: 0 hours 14 minutes 34 seconds  Findings:      The perianal and digital rectal examinations revealed grade 3       hemorrhoids.      The colon (entire examined portion)  appeared normal. Scattered       mamillation's present in the rectal mucosa. Estimated blood loss: none.      No additional abnormalities were found on retroflexion. Impression:               - The entire examined colon is normal.                           - No specimens collected. Grade 3                            hemorrhoids?"likely source of hematochezia. Moderate Sedation:      Moderate (conscious) sedation was personally administered by an       anesthesia professional. The following parameters were monitored: oxygen       saturation, heart rate, blood pressure, respiratory rate, EKG, adequacy       of pulmonary ventilation, and response to care. Total physician       intraservice time was 35 minutes. Recommendation:           - Patient has a contact number available for                            emergencies. The signs and symptoms of potential  delayed complications were discussed with the                            patient. Return to normal activities tomorrow.                            Written discharge instructions were provided to the                            patient.                           - Advance diet as tolerated.                           - Continue present medications.                           - Repeat colonoscopy in 10 years for screening                            purposes.                           - Return to GI office in 6 weeks. patient may be a                            good candidate for hemorr. Hemorrhoid banding                            pamphlet provided. Procedure Code(s):        --- Professional ---                           [phone], Colonoscopy, flexible; diagnostic, including                            collection of specimen(s) by brushing or washing,                            when performed (separate procedure) Diagnosis Code(s):        --- Professional ---                           K92.1, Melena (includes  Hematochezia) CPT copyright 2016 American Medical Association. All rights reserved. The codes documented in this report are preliminary and upon coder review may  be revised to meet current compliance requirements. Cristopher Estimable. Meaghann Choo, MD Norvel Richards, MD 04/26/2016 10:22:17 AM This report has been signed electronically. Number of Addenda: 0

## 2016-04-26 NOTE — Anesthesia Postprocedure Evaluation (Signed)
Anesthesia Post Note  Patient: Donna Ramirez  Procedure(s) Performed: Procedure(s) (LRB): COLONOSCOPY WITH PROPOFOL (N/A) ESOPHAGOGASTRODUODENOSCOPY (EGD) WITH PROPOFOL (N/A) MALONEY DILATION (N/A)  Patient location during evaluation: PACU Anesthesia Type: MAC Level of consciousness: awake, oriented and patient cooperative Pain management: pain level controlled Vital Signs Assessment: post-procedure vital signs reviewed and stable Respiratory status: spontaneous breathing, respiratory function stable and nonlabored ventilation Cardiovascular status: blood pressure returned to baseline Postop Assessment: no signs of nausea or vomiting Anesthetic complications: no    Last Vitals:  Filed Vitals:   04/26/16 0920 04/26/16 0925  BP: 115/76 127/79  Temp:    Resp: 12 14    Last Pain: There were no vitals filed for this visit.               Leita Lindbloom J

## 2016-04-26 NOTE — Op Note (Signed)
[facility] Patient Name: Donna Ramirez Procedure Date: 04/26/2016 8:59 AM MRN: [id number] Date of Birth: 02/16/1971 Attending MD: Norvel Richards , MD CSN: [id number] Age: 45 Admit Type: Outpatient Procedure:                Upper GI endoscopy with [facility] dilation Indications:              Heartburn; dysphagia Providers:                Norvel Richards, MD, Lurline Del, RN, Isabella Stalling, Technician Referring MD:              Medicines:                Monitored Anesthesia Care Complications:            No immediate complications. Estimated Blood Loss:     Estimated blood loss: none. Procedure:                Pre-Anesthesia Assessment:                           - Prior to the procedure, a History and Physical                            was performed, and patient medications and                            allergies were reviewed. The patient's tolerance of                            previous anesthesia was also reviewed. The risks                            and benefits of the procedure and the sedation                            options and risks were discussed with the patient.                            All questions were answered, and informed consent                            was obtained. Prior Anticoagulants: The patient has                            taken no previous anticoagulant or antiplatelet                            agents. ASA Grade Assessment: II - A patient with                            mild systemic disease. After reviewing the risks  and benefits, the patient was deemed in                            satisfactory condition to undergo the procedure.                           After obtaining informed consent, the endoscope was                            passed under direct vision. Throughout the                            procedure, the patient's blood pressure, pulse, and   oxygen saturations were monitored continuously. The                            [id number]) scope was introduced through the                            mouth, and advanced to the second part of duodenum.                            The upper GI endoscopy was accomplished without                            difficulty. The patient tolerated the procedure                            well. Scope In: 9:40:30 AM Scope Out: 9:45:36 AM Scope Withdrawal Time: 0 hours 0 minutes 1 second  Total Procedure Duration: 0 hours 5 minutes 6 seconds  Findings:      A mild Schatzki ring (acquired) was found at the gastroesophageal       junction.      No other significant abnormalities were identified in a careful       examination of the esophagus.      A small hiatal hernia was present. Stomach empty. Normal gastric mucosa.      The exam was otherwise without abnormality.      The second portion of the duodenum was normal. The scope was withdrawn.       Dilation was performed with a Maloney dilator with no resistance at 65       Fr. The dilation site was examined following endoscope reinsertion and       showed no change. Estimated blood loss: none. Impression:               - Mild Schatzki ring. Dilated.                           - Small hiatal hernia.                           - The examination was otherwise normal.                           - Normal second portion of the duodenum.                           -  No specimens collected. Moderate Sedation:      Moderate (conscious) sedation was personally administered by an       anesthesia professional. The following parameters were monitored: oxygen       saturation, heart rate, blood pressure, respiratory rate, EKG, adequacy       of pulmonary ventilation, and response to care. Total physician       intraservice time was 15 minutes. Recommendation:           - Patient has a contact number available for                            emergencies. The  signs and symptoms of potential                            delayed complications were discussed with the                            patient. Return to normal activities tomorrow.                            Written discharge instructions were provided to the                            patient.                           - Advance diet as tolerated.                           - Continue present medications.                           - Use Protonix (pantoprazole) 40 mg PO daily                            indefinitely. Procedure Code(s):        --- Professional ---                           [phone], Esophagogastroduodenoscopy, flexible,                            transoral; diagnostic, including collection of                            specimen(s) by brushing or washing, when performed                            (separate procedure)                           43450, Dilation of esophagus, by unguided sound or                            bougie, single or multiple passes Diagnosis Code(s):        --- Professional ---  K22.2, Esophageal obstruction                           K44.9, Diaphragmatic hernia without obstruction or                            gangrene                           R12, Heartburn CPT copyright 2016 American Medical Association. All rights reserved. The codes documented in this report are preliminary and upon coder review may  be revised to meet current compliance requirements. Cristopher Estimable. Andrian Sabala, MD Norvel Richards, MD 04/26/2016 10:16:48 AM This report has been signed electronically. Number of Addenda: 0

## 2016-04-26 NOTE — Transfer of Care (Signed)
Immediate Anesthesia Transfer of Care Note  Patient: Donna Ramirez  Procedure(s) Performed: Procedure(s) with comments: COLONOSCOPY WITH PROPOFOL (N/A) - 1015 - moved to 9:30 - office to notify ESOPHAGOGASTRODUODENOSCOPY (EGD) WITH PROPOFOL (N/A) MALONEY DILATION (N/A)  Patient Location: PACU  Anesthesia Type:MAC  Level of Consciousness: awake, alert  and patient cooperative  Airway & Oxygen Therapy: Patient Spontanous Breathing and Patient connected to face mask oxygen  Post-op Assessment: Report given to RN, Post -op Vital signs reviewed and stable and Patient moving all extremities  Post vital signs: Reviewed and stable  Last Vitals:  Filed Vitals:   04/26/16 0920 04/26/16 0925  BP: 115/76 127/79  Temp:    Resp: 12 14    Last Pain: There were no vitals filed for this visit.    Patients Stated Pain Goal: 7 ([id number])  Complications: No apparent anesthesia complications

## 2016-04-28 ENCOUNTER — Encounter (HOSPITAL_COMMUNITY): Payer: Self-pay | Admitting: Internal Medicine

## 2016-06-03 ENCOUNTER — Ambulatory Visit: Payer: Medicare Other | Admitting: Nurse Practitioner

## 2016-06-15 ENCOUNTER — Ambulatory Visit: Payer: Medicare Other | Admitting: Nurse Practitioner

## 2016-06-30 ENCOUNTER — Ambulatory Visit (INDEPENDENT_AMBULATORY_CARE_PROVIDER_SITE_OTHER): Payer: Medicare Other | Admitting: Orthopaedic Surgery

## 2016-06-30 ENCOUNTER — Encounter: Payer: Self-pay | Admitting: Orthopaedic Surgery

## 2016-06-30 VITALS — BP 115/73 | HR 94 | Temp 97.0°F | Ht 65.0 in | Wt 184.6 lb

## 2016-06-30 DIAGNOSIS — M545 Low back pain, unspecified: Secondary | ICD-10-CM

## 2016-06-30 DIAGNOSIS — F17200 Nicotine dependence, unspecified, uncomplicated: Secondary | ICD-10-CM

## 2016-06-30 DIAGNOSIS — Z720 Tobacco use: Secondary | ICD-10-CM

## 2016-06-30 MED ORDER — HYDROCODONE-ACETAMINOPHEN 7.5-325 MG PO TABS: 1.0000 | ORAL_TABLET | ORAL | 0 refills | Status: DC | PRN

## 2016-06-30 NOTE — Progress Notes (Signed)
Patient Donna Ramirez, female DOB:[birth year], 45 y.o. [id number]  Chief Complaint  Patient presents with  . Follow-up    Bilateral hip pain    HPI  Donna Ramirez is a 45 y.o. female who has lower back pain and hip pain bilaterally.  She has no new trauma,no falls, no weakness,no bowel or bladder problems.  She has not been doing exercises and I will give her an exercise sheet.  She does not have ability to go to PT.  I will refill her medicine as well. HPI  Body mass index is 30.72 kg/m.  ROS  Review of Systems  Constitutional:       Patient does not have Diabetes Mellitus. Patient does not have hypertension. Patient does not have COPD or shortness of breath. Patient does not have BMI > 35. Patient has current smoking history.  HENT: Negative for congestion.   Respiratory: Negative for cough and shortness of breath.   Cardiovascular: Negative for chest pain and leg swelling.  Gastrointestinal:       Fatty Liver  Endocrine: Negative for cold intolerance.       Non Hodgkins Lymphoma  Musculoskeletal: Positive for back pain.  Allergic/Immunologic: Positive for environmental allergies.    Past Medical History:  Diagnosis Date  . Abdominal wall pain   . Anxiety   . Chronic back pain   . DDD (degenerative disc disease)   . Depression   . Duodenitis   . Fatty liver   . Fibroid, uterine   . Hearing loss   . Marijuana abuse   . Non Hodgkin's lymphoma ([place]) 2011    Past Surgical History:  Procedure Laterality Date  . ABDOMINAL HYSTERECTOMY  2008   complete  . COLONOSCOPY  07/31/10   Rourk-hyperplastic polyp  . COLONOSCOPY WITH PROPOFOL N/A 04/26/2016   Procedure: COLONOSCOPY WITH PROPOFOL;  Surgeon: Daneil Dolin, MD;  Location: [facility];  Service: Endoscopy;  Laterality: N/A;  1015 - moved to 9:30 - office to notify  . ESOPHAGOGASTRODUODENOSCOPY  08/19/10   Rourk-noncritical schatzki ring, abnormal and pillow   . ESOPHAGOGASTRODUODENOSCOPY  04/2008   Rourk-mild erosive reflux esophagitis, noncritical Schatzki's ring status post dilation, 1.5 cm pedunculated mass distal D2  . ESOPHAGOGASTRODUODENOSCOPY  10/09   [facility]-normal  . ESOPHAGOGASTRODUODENOSCOPY (EGD) WITH PROPOFOL N/A 04/26/2016   Procedure: ESOPHAGOGASTRODUODENOSCOPY (EGD) WITH PROPOFOL;  Surgeon: Daneil Dolin, MD;  Location: [facility];  Service: Endoscopy;  Laterality: N/A;  . EUS  6/09   Jacobs-6-8 MM nodule seen with recent mucosal biopsy 2-3 cm distal to the major papilla, biopsy benign, 1 cm or adenomatous-appearing mucus at the major papilla biopsy (focal mild inflammation associated with slight atypia)  . LYMPHADENECTOMY  2011  . MALONEY DILATION N/A 04/26/2016   Procedure: Venia Minks DILATION;  Surgeon: Daneil Dolin, MD;  Location: [facility];  Service: Endoscopy;  Laterality: N/A;    Family History  Problem Relation Age of Onset  . Diabetes Mother   . Breast cancer Mother   . Hypertension Other   . Hyperlipidemia Other   . Cancer Other   . Colon polyps Father 19    frequent polyps  . Colon cancer Paternal Grandmother     also paternal great uncle    Social History Social History  Substance Use Topics  . Smoking status: Current Every Day Smoker    Packs/day: 1.00    Years: 25.00    Types: Cigarettes  . Smokeless tobacco: Never Used  . Alcohol  use 0.0 oz/week     Comment: rarely couple times per yr    No Known Allergies  Current Outpatient Prescriptions  Medication Sig Dispense Refill  . DULoxetine (CYMBALTA) 30 MG capsule Take 30 mg by mouth daily.     . ondansetron (ZOFRAN ODT) 4 MG disintegrating tablet 4mg  ODT q4 hours prn nausea/vomit 12 tablet 0  . PARoxetine (PAXIL) 40 MG tablet Take 40 mg by mouth every morning.    . polyethylene glycol-electrolytes (TRILYTE) 420 g solution Take 4,000 mLs by mouth as directed. 4000 mL 0  . HYDROcodone-acetaminophen (NORCO) 7.5-325 MG tablet Take 1 tablet by mouth every 4 (four) hours as needed for moderate  pain (Must last 30 days.  Do not drive or operate machinery while taking this medicine.). 120 tablet 0   No current facility-administered medications for this visit.      Physical Exam  Blood pressure 115/73, pulse 94, temperature 97 F (36.1 C), height 5\' 5"  (1.651 m), weight 184 lb 9.6 oz (83.7 kg).  Constitutional: overall normal hygiene, normal nutrition, well developed, normal grooming, normal body habitus. Assistive device:none  Musculoskeletal: gait and station Limp none, muscle tone and strength are normal, no tremors or atrophy is present.  .  Neurological: coordination overall normal.  Deep tendon reflex/nerve stretch intact.  Sensation normal.  Cranial nerves II-XII intact.   Skin:   normal overall no scars, lesions, ulcers or rashes. No psoriasis.  Psychiatric: Alert and oriented x 3.  Recent memory intact, remote memory unclear.  Normal mood and affect. Well groomed.  Good eye contact.  Cardiovascular: overall no swelling, no varicosities, no edema bilaterally, normal temperatures of the legs and arms, no clubbing, cyanosis and good capillary refill.  Lymphatic: palpation is normal.  Spine/Pelvis examination:  Inspection:  Overall, sacoiliac joint benign and hips nontender; without crepitus or defects.   Thoracic spine inspection: Alignment normal without kyphosis present   Lumbar spine inspection:  Alignment  with normal lumbar lordosis, without scoliosis apparent.   Thoracic spine palpation:  without tenderness of spinal processes   Lumbar spine palpation: with tenderness of lumbar area; without tightness of lumbar muscles    Range of Motion:   Lumbar flexion, forward flexion is 35 without pain or tenderness    Lumbar extension is full without pain or tenderness   Left lateral bend is Normal  without pain or tenderness   Right lateral bend is Normal without pain or tenderness   Straight leg raising is Normal   Strength & tone: Normal   Stability overall  normal stability     The patient has been educated about the nature of the problem(s) and counseled on treatment options.  The patient appeared to understand what I have discussed and is in agreement with it.  Encounter Diagnoses  Name Primary?  . Midline low back pain without sciatica Yes  . Tobacco smoker within last 12 months     PLAN Call if any problems.  Precautions discussed.  Continue current medications.   Return to clinic 3 months   Exercise sheet given.  Electronically Signed Sanjuana Kava, MD 7/26/20172:18 PM

## 2016-06-30 NOTE — Patient Instructions (Signed)
Back Exercises The following exercises strengthen the muscles that help to support the back. They also help to keep the lower back flexible. Doing these exercises can help to prevent back pain or lessen existing pain. If you have back pain or discomfort, try doing these exercises 2-3 times each day or as told by your health care provider. When the pain goes away, do them once each day, but increase the number of times that you repeat the steps for each exercise (do more repetitions). If you do not have back pain or discomfort, do these exercises once each day or as told by your health care provider. EXERCISES Single Knee to Chest Repeat these steps 3-5 times for each leg: 1. Lie on your back on a firm bed or the floor with your legs extended. 2. Bring one knee to your chest. Your other leg should stay extended and in contact with the floor. 3. Hold your knee in place by grabbing your knee or thigh. 4. Pull on your knee until you feel a gentle stretch in your lower back. 5. Hold the stretch for 10-30 seconds. 6. Slowly release and straighten your leg. Pelvic Tilt Repeat these steps 5-10 times: 1. Lie on your back on a firm bed or the floor with your legs extended. 2. Bend your knees so they are pointing toward the ceiling and your feet are flat on the floor. 3. Tighten your lower abdominal muscles to press your lower back against the floor. This motion will tilt your pelvis so your tailbone points up toward the ceiling instead of pointing to your feet or the floor. 4. With gentle tension and even breathing, hold this position for 5-10 seconds. Cat-Cow Repeat these steps until your lower back becomes more flexible: 1. Get into a hands-and-knees position on a firm surface. Keep your hands under your shoulders, and keep your knees under your hips. You may place padding under your knees for comfort. 2. Let your head hang down, and point your tailbone toward the floor so your lower back becomes  rounded like the back of a cat. 3. Hold this position for 5 seconds. 4. Slowly lift your head and point your tailbone up toward the ceiling so your back forms a sagging arch like the back of a cow. 5. Hold this position for 5 seconds. Press-Ups Repeat these steps 5-10 times: 1. Lie on your abdomen (face-down) on the floor. 2. Place your palms near your head, about shoulder-width apart. 3. While you keep your back as relaxed as possible and keep your hips on the floor, slowly straighten your arms to raise the top half of your body and lift your shoulders. Do not use your back muscles to raise your upper torso. You may adjust the placement of your hands to make yourself more comfortable. 4. Hold this position for 5 seconds while you keep your back relaxed. 5. Slowly return to lying flat on the floor. Bridges Repeat these steps 10 times: 1. Lie on your back on a firm surface. 2. Bend your knees so they are pointing toward the ceiling and your feet are flat on the floor. 3. Tighten your buttocks muscles and lift your buttocks off of the floor until your waist is at almost the same height as your knees. You should feel the muscles working in your buttocks and the back of your thighs. If you do not feel these muscles, slide your feet 1-2 inches farther away from your buttocks. 4. Hold this position for 3-5   seconds. 5. Slowly lower your hips to the starting position, and allow your buttocks muscles to relax completely. If this exercise is too easy, try doing it with your arms crossed over your chest. Abdominal Crunches Repeat these steps 5-10 times: 1. Lie on your back on a firm bed or the floor with your legs extended. 2. Bend your knees so they are pointing toward the ceiling and your feet are flat on the floor. 3. Cross your arms over your chest. 4. Tip your chin slightly toward your chest without bending your neck. 5. Tighten your abdominal muscles and slowly raise your trunk (torso) high  enough to lift your shoulder blades a tiny bit off of the floor. Avoid raising your torso higher than that, because it can put too much stress on your low back and it does not help to strengthen your abdominal muscles. 6. Slowly return to your starting position. Back Lifts Repeat these steps 5-10 times: 1. Lie on your abdomen (face-down) with your arms at your sides, and rest your forehead on the floor. 2. Tighten the muscles in your legs and your buttocks. 3. Slowly lift your chest off of the floor while you keep your hips pressed to the floor. Keep the back of your head in line with the curve in your back. Your eyes should be looking at the floor. 4. Hold this position for 3-5 seconds. 5. Slowly return to your starting position. SEEK MEDICAL CARE IF:  Your back pain or discomfort gets much worse when you do an exercise.  Your back pain or discomfort does not lessen within 2 hours after you exercise. If you have any of these problems, stop doing these exercises right away. Do not do them again unless your health care provider says that you can. SEEK IMMEDIATE MEDICAL CARE IF:  You develop sudden, severe back pain. If this happens, stop doing the exercises right away. Do not do them again unless your health care provider says that you can.   This information is not intended to replace advice given to you by your health care provider. Make sure you discuss any questions you have with your health care provider.   Document Released: 12/30/2004 Document Revised: 08/13/2015 Document Reviewed: 01/16/2015 Elsevier Interactive Patient Education 2016 [employer] Chronic Back Pain  When back pain lasts longer than 3 months, it is called chronic back pain.People with chronic back pain often go through certain periods that are more intense (flare-ups).  CAUSES Chronic back pain can be caused by wear and tear (degeneration) on different structures in your back. These structures include:  The bones  of your spine (vertebrae) and the joints surrounding your spinal cord and nerve roots (facets).  The strong, fibrous tissues that connect your vertebrae (ligaments). Degeneration of these structures may result in pressure on your nerves. This can lead to constant pain. HOME CARE INSTRUCTIONS  Avoid bending, heavy lifting, prolonged sitting, and activities which make the problem worse.  Take brief periods of rest throughout the day to reduce your pain. Lying down or standing usually is better than sitting while you are resting.  Take over-the-counter or prescription medicines only as directed by your caregiver. SEEK IMMEDIATE MEDICAL CARE IF:   You have weakness or numbness in one of your legs or feet.  You have trouble controlling your bladder or bowels.  You have nausea, vomiting, abdominal pain, shortness of breath, or fainting.   This information is not intended to replace advice given to you by your  health care provider. Make sure you discuss any questions you have with your health care provider.   Document Released: 12/30/2004 Document Revised: 02/14/2012 Document Reviewed: 05/12/2015 Elsevier Interactive Patient Education [employer].

## 2016-08-18 ENCOUNTER — Telehealth: Payer: Self-pay | Admitting: Orthopaedic Surgery

## 2016-08-18 MED ORDER — HYDROCODONE-ACETAMINOPHEN 7.5-325 MG PO TABS: ORAL_TABLET | ORAL | 0 refills | Status: DC

## 2016-08-18 NOTE — Telephone Encounter (Signed)
Patient requests refill, HYDROcodone-acetaminophen (NORCO) 7.5-325 MG tablet  - Insurance is Medicaid

## 2016-09-30 ENCOUNTER — Ambulatory Visit: Payer: Medicare Other | Admitting: Orthopaedic Surgery

## 2017-03-23 ENCOUNTER — Ambulatory Visit (INDEPENDENT_AMBULATORY_CARE_PROVIDER_SITE_OTHER): Payer: Medicare Other | Admitting: Nurse Practitioner

## 2017-03-23 ENCOUNTER — Encounter: Payer: Self-pay | Admitting: Nurse Practitioner

## 2017-03-23 VITALS — BP 127/82 | HR 98 | Temp 96.7°F | Ht 65.0 in | Wt 179.2 lb

## 2017-03-23 DIAGNOSIS — R112 Nausea with vomiting, unspecified: Secondary | ICD-10-CM | POA: Diagnosis not present

## 2017-03-23 DIAGNOSIS — R1030 Lower abdominal pain, unspecified: Secondary | ICD-10-CM | POA: Diagnosis not present

## 2017-03-23 MED ORDER — ONDANSETRON HCL 4 MG PO TABS: 4.0000 mg | ORAL_TABLET | Freq: Three times a day (TID) | ORAL | 1 refills | Status: DC | PRN

## 2017-03-23 MED ORDER — DICYCLOMINE HCL 10 MG PO CAPS: 10.0000 mg | ORAL_CAPSULE | Freq: Three times a day (TID) | ORAL | 0 refills | Status: DC | PRN

## 2017-03-23 NOTE — Progress Notes (Signed)
cc'ed to pcp °

## 2017-03-23 NOTE — Assessment & Plan Note (Signed)
Abdominal pain was doing pretty well until recently when she thinks she may have gotten food poisoning or a stomach bug. She is now having lower abdominal crampy type pain which is intermittent as of the past 1-2 weeks. Has had some increased constipation as of late. At this point I will have her take MiraLAX one to 2 times a day as needed for intermittent constipation. I will send in a prescription of Bentyl 10 mg 3 times a day as needed for abdominal cramping. Return for follow-up in 6 months.

## 2017-03-23 NOTE — Patient Instructions (Signed)
1. I sent and Zofran 4 mg pills to your pharmacy. You can take this every 8 hours as needed for nausea. It should not make you sleepy. 2. I sent in Bentyl 10 mg to your pharmacy. You can take this up to 3 times a day as needed for abdominal cramping. 3. Take MiraLAX one to 2 times a day as needed for occasional constipation. 4. Return for follow-up in 6 months.

## 2017-03-23 NOTE — Progress Notes (Signed)
Referring Provider: Ardine Eng, MD Primary Care Physician:  Ardine Eng, MD Primary GI:  Dr. Gala Romney  Chief Complaint  Patient presents with  . Abdominal Pain    HPI:   Donna Ramirez is a 46 y.o. female who presents for abdominal pain. The patient was last seen in our office 04/01/2016 for dysphagia, dyspepsia, hematochezia. Previously had an extensive workup for chronic abdominal pain by our office which included Colonoscopy, EGD (both in 2011) with subtle abnormality of the ampulla s/p biopsy. 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 Noted limited views of pancreas, liver, spleen, portal vessels all noted as normal.  At her last visit she noted her nausea was improved and is helped by Zofran. She had been recently seen in the emergency department for abdominal pain and hematochezia with labs and x-rays unremarkable and continued abdominal pain unless she takes Ultram. Her pain was described as an ache, periumbilical. Some regurgitation and some dysphagia. Recommended colonoscopy and endoscopy with possible dilation, continued Zofran, work on marijuana cessation to prevent possible cannabinoid hyperemesis syndrome. Return for follow-up in 2-3 months. She did not follow up as recommended.  EGD completed 04/26/2016 which found mild Schatzki's ring status post dilation, small hiatal hernia, examination otherwise normal. Recommended Protonix daily indefinitely. Colonoscopy completed at the same day found entire examined colon normal, grade 3 hemorrhoids as likely source of hematochezia. Repeat colonoscopy in 10 years. Patient deemed likely good hemorrhoid banding candidate.  She has never been on a PPI, per review of our records.  Today she states she's doing well overall. Today she states she had some stomach issues "I  got really really sick" thinks she may have had food poisoning. Has had stomach cramps, a lot of gas, and nausea. Cramping intermittent and typically postprandial, lower abdomen. Phenergan helps her nausea but can't take during the day because of drowsiness. Typically with a bowel movement about every 2-3 days. Stools typically [place] 4. Has had some increase constipation lately: "I think I've been eating too much cheese." Zofran also helps nausea. Denies hematochezia, melena, fever, chills, unintentional weight loss (over the past couple weeks after getting sick with stomach but or food poisoning.)  Denies GERD symptoms/dyspepsia. Denies fever, chills, acute changes in bowel habits. Denies chest pain, dyspnea, dizziness, lightheadedness, syncope, near syncope. Denies any other upper or lower GI symptoms.  Has cut back on Marijuana use because "that sometimes makes me feel icky."  Past Medical History:  Diagnosis Date  . Abdominal wall pain   . Anxiety   . Chronic back pain   . DDD (degenerative disc disease)   . Depression   . Duodenitis   . Fatty liver   . Fibroid, uterine   . Hearing loss   . Marijuana abuse   . Non Hodgkin's lymphoma ([place]) 2011    Past Surgical History:  Procedure Laterality Date  . ABDOMINAL HYSTERECTOMY  2008   complete  . COLONOSCOPY  07/31/10   Rourk-hyperplastic polyp  . COLONOSCOPY WITH PROPOFOL N/A 04/26/2016   Procedure: COLONOSCOPY WITH PROPOFOL;  Surgeon: Daneil Dolin, MD;  [facility];  Service: Endoscopy;  Laterality: N/A;  1015 - moved to 9:30 - office to notify  . ESOPHAGOGASTRODUODENOSCOPY  08/19/10   Rourk-noncritical schatzki ring, abnormal and pillow   . ESOPHAGOGASTRODUODENOSCOPY  04/2008  Rourk-mild erosive reflux esophagitis, noncritical Schatzki's ring status post dilation, 1.5 cm pedunculated mass distal D2  . ESOPHAGOGASTRODUODENOSCOPY  10/09   [facility]-normal  . ESOPHAGOGASTRODUODENOSCOPY (EGD) WITH PROPOFOL N/A 04/26/2016    Procedure: ESOPHAGOGASTRODUODENOSCOPY (EGD) WITH PROPOFOL;  Surgeon: Daneil Dolin, MD;  [facility];  Service: Endoscopy;  Laterality: N/A;  . EUS  6/09   Jacobs-6-8 MM nodule seen with recent mucosal biopsy 2-3 cm distal to the major papilla, biopsy benign, 1 cm or adenomatous-appearing mucus at the major papilla biopsy (focal mild inflammation associated with slight atypia)  . LYMPHADENECTOMY  2011  . MALONEY DILATION N/A 04/26/2016   Procedure: Venia Minks DILATION;  Surgeon: Daneil Dolin, MD;  [facility];  Service: Endoscopy;  Laterality: N/A;    Current Outpatient Prescriptions  Medication Sig Dispense Refill  . DULoxetine (CYMBALTA) 30 MG capsule Take 30 mg by mouth daily.     . traMADol (ULTRAM) 50 MG tablet Take by mouth.    . dicyclomine (BENTYL) 10 MG capsule Take 1 capsule (10 mg total) by mouth 3 (three) times daily as needed for spasms. 90 capsule 0  . ondansetron (ZOFRAN) 4 MG tablet Take 1 tablet (4 mg total) by mouth every 8 (eight) hours as needed for nausea or vomiting. 30 tablet 1   No current facility-administered medications for this visit.     Allergies as of 03/23/2017  . (No Known Allergies)    Family History  Problem Relation Age of Onset  . Diabetes Mother   . Breast cancer Mother   . Hypertension Other   . Hyperlipidemia Other   . Cancer Other   . Colon polyps Father 61    frequent polyps  . Colon cancer Paternal Grandmother     also paternal great uncle    Social History   Social History  . Marital status: Single    Spouse name: N/A  . Number of children: 0  . Years of education: N/A   Occupational History  . disabled Unemployed   Social History Main Topics  . Smoking status: Current Every Day Smoker    Packs/day: 1.00    Years: 25.00    Types: Cigarettes  . Smokeless tobacco: Never Used  . Alcohol use 0.0 oz/week     Comment: rarely couple times per yr  . Drug use: Yes    Frequency: 4.0 times per week     Types: Marijuana     Comment: Cut back; Last used 03/22/17.  Marland Kitchen Sexual activity: No   Other Topics Concern  . None   Social History Narrative  . None    Review of Systems: General: Negative for anorexia, weight loss, fever, chills, fatigue, weakness. ENT: Negative for hoarseness, difficulty swallowing , nasal congestion. CV: Negative for chest pain, angina, palpitations, dyspnea on exertion, peripheral edema.  Respiratory: Negative for dyspnea at rest, cough, sputum, wheezing.  GI: See history of present illness. Endo: Negative for unusual weight change.  Heme: Negative for bruising or bleeding.   Physical Exam: BP 127/82   Pulse 98   Temp (!) 96.7 F (35.9 C) (Oral)   Ht 5\' 5"  (1.651 m)   Wt 179 lb 3.2 oz (81.3 kg)   BMI 29.82 kg/m  General:   Alert and oriented. Pleasant and cooperative. Well-nourished and well-developed.  Eyes:  Without icterus, sclera clear and conjunctiva pink.  Ears:  Normal auditory acuity. Cardiovascular:  S1, S2 present without murmurs appreciated. Extremities without clubbing or edema. Respiratory:  Clear to auscultation bilaterally. No wheezes, rales, or rhonchi. No distress.  Gastrointestinal:  +BS, rounded but soft, and non-distended. Mild lower abdominal TTP. No HSM noted. No guarding or rebound. No masses appreciated.  Rectal:  Deferred  Musculoskalatal:  Symmetrical without gross deformities. Neurologic:  Alert and oriented x4;  grossly normal neurologically. Psych:  Alert and cooperative. Normal mood and affect. Heme/Lymph/Immune: No excessive bruising noted.    03/23/2017 11:02 AM   Disclaimer: This note was dictated with voice recognition software. Similar sounding words can inadvertently be transcribed and may not be corrected upon review.

## 2017-03-23 NOTE — Assessment & Plan Note (Signed)
Intermittent nausea which seems to worsen since "I think I got the stomach bug or food poisoning." Phenergan tends to help, however it makes her drowsy and she can take it during the day. Previously we saw her she stated Zofran also helped. I will send in a prescription for Zofran to her pharmacy. Return for follow-up in 6 months.

## 2017-08-23 ENCOUNTER — Other Ambulatory Visit (HOSPITAL_COMMUNITY): Payer: Self-pay | Admitting: Family Medicine

## 2017-08-23 DIAGNOSIS — Z1231 Encounter for screening mammogram for malignant neoplasm of breast: Secondary | ICD-10-CM

## 2017-09-02 ENCOUNTER — Ambulatory Visit (HOSPITAL_COMMUNITY)
Admission: RE | Admit: 2017-09-02 | Discharge: 2017-09-02 | Disposition: A | Discharge status: Home / Self Care | Payer: Medicare Other | Source: Ambulatory Visit | Attending: Family Medicine | Admitting: Family Medicine

## 2017-09-02 DIAGNOSIS — Z1231 Encounter for screening mammogram for malignant neoplasm of breast: Secondary | ICD-10-CM | POA: Diagnosis not present

## 2017-09-22 ENCOUNTER — Telehealth: Payer: Self-pay | Admitting: Nurse Practitioner

## 2017-09-22 ENCOUNTER — Encounter: Payer: Self-pay | Admitting: Nurse Practitioner

## 2017-09-22 ENCOUNTER — Ambulatory Visit: Payer: Medicare Other | Admitting: Nurse Practitioner

## 2017-09-22 NOTE — Telephone Encounter (Signed)
Noted  

## 2017-09-22 NOTE — Telephone Encounter (Signed)
PATIENT WAS A NO SHOW AND LETTER SENT  °

## 2018-01-24 ENCOUNTER — Ambulatory Visit (INDEPENDENT_AMBULATORY_CARE_PROVIDER_SITE_OTHER): Payer: Medicare Other

## 2018-01-24 ENCOUNTER — Ambulatory Visit (INDEPENDENT_AMBULATORY_CARE_PROVIDER_SITE_OTHER): Payer: Medicare Other | Admitting: Orthopaedic Surgery

## 2018-01-24 ENCOUNTER — Encounter: Payer: Self-pay | Admitting: Orthopaedic Surgery

## 2018-01-24 VITALS — BP 134/77 | HR 71 | Temp 98.1°F | Ht 65.0 in | Wt 174.0 lb

## 2018-01-24 DIAGNOSIS — M5441 Lumbago with sciatica, right side: Secondary | ICD-10-CM

## 2018-01-24 DIAGNOSIS — G8929 Other chronic pain: Secondary | ICD-10-CM

## 2018-01-24 DIAGNOSIS — M25551 Pain in right hip: Secondary | ICD-10-CM

## 2018-01-24 MED ORDER — HYDROCODONE-ACETAMINOPHEN 5-325 MG PO TABS: ORAL_TABLET | ORAL | 0 refills | Status: DC

## 2018-01-24 MED ORDER — NAPROXEN 500 MG PO TABS: 500.0000 mg | ORAL_TABLET | Freq: Two times a day (BID) | ORAL | 5 refills | Status: DC

## 2018-01-24 NOTE — Progress Notes (Signed)
Patient Donna Ramirez, female DOB:1970-12-17, 47 y.o. PTW:[id number]  Chief Complaint  Patient presents with  . Follow-up    Recheck on low back pain.    HPI  Donna Ramirez is a 47 y.o. female who has right hip pain and right sided lower back pain.  She has no trauma.  I saw her in July 2017 and she has done well until recently.  She has pain at times with movement of the right hip and other times not.  She has more right sided lower back pain that is pretty constant for days at a time.  She has no paresthesias, no weakness, no trauma, no redness.  She has not really done anything for it.  She is tried of hurting.   HPI  Body mass index is 28.96 kg/m.  ROS  Review of Systems  Constitutional:       Patient does not have Diabetes Mellitus. Patient does not have hypertension. Patient does not have COPD or shortness of breath. Patient does not have BMI > 35. Patient has current smoking history.  HENT: Positive for hearing loss. Negative for congestion.   Respiratory: Negative for cough and shortness of breath.   Cardiovascular: Negative for chest pain and leg swelling.  Gastrointestinal:       Fatty Liver  Endocrine: Negative for cold intolerance.       Non Hodgkins Lymphoma  Musculoskeletal: Positive for back pain.  Allergic/Immunologic: Positive for environmental allergies.  Psychiatric/Behavioral: The patient is nervous/anxious.   All other systems reviewed and are negative.   Past Medical History:  Diagnosis Date  . Abdominal wall pain   . Anxiety   . Chronic back pain   . DDD (degenerative disc disease)   . Depression   . Duodenitis   . Fatty liver   . Fibroid, uterine   . Hearing loss   . Marijuana abuse   . Non Hodgkin's lymphoma ([place]) 2011    Past Surgical History:  Procedure Laterality Date  . ABDOMINAL HYSTERECTOMY  2008   complete  . COLONOSCOPY  07/31/10   Rourk-hyperplastic polyp  . COLONOSCOPY WITH PROPOFOL N/A 04/26/2016   Procedure: COLONOSCOPY  WITH PROPOFOL;  Surgeon: Daneil Dolin, MD;  [facility];  Service: Endoscopy;  Laterality: N/A;  1015 - moved to 9:30 - office to notify  . ESOPHAGOGASTRODUODENOSCOPY  08/19/10   Rourk-noncritical schatzki ring, abnormal and pillow   . ESOPHAGOGASTRODUODENOSCOPY  04/2008   Rourk-mild erosive reflux esophagitis, noncritical Schatzki's ring status post dilation, 1.5 cm pedunculated mass distal D2  . ESOPHAGOGASTRODUODENOSCOPY  10/09   [facility]-normal  . ESOPHAGOGASTRODUODENOSCOPY (EGD) WITH PROPOFOL N/A 04/26/2016   Procedure: ESOPHAGOGASTRODUODENOSCOPY (EGD) WITH PROPOFOL;  Surgeon: Daneil Dolin, MD;  [facility];  Service: Endoscopy;  Laterality: N/A;  . EUS  6/09   Jacobs-6-8 MM nodule seen with recent mucosal biopsy 2-3 cm distal to the major papilla, biopsy benign, 1 cm or adenomatous-appearing mucus at the major papilla biopsy (focal mild inflammation associated with slight atypia)  . LYMPHADENECTOMY  2011  . MALONEY DILATION N/A 04/26/2016   Procedure: Venia Minks DILATION;  Surgeon: Daneil Dolin, MD;  [facility];  Service: Endoscopy;  Laterality: N/A;    Family History  Problem Relation Age of Onset  . Diabetes Mother   . Breast cancer Mother   . Hypertension Other   . Hyperlipidemia Other   . Cancer Other   . Colon polyps Father 58  frequent polyps  . Colon cancer Paternal Grandmother        also paternal great uncle    Social History Social History   Tobacco Use  . Smoking status: Current Every Day Smoker    Packs/day: 1.00    Years: 25.00    Pack years: 25.00    Types: Cigarettes  . Smokeless tobacco: Never Used  Substance Use Topics  . Alcohol use: Yes    Alcohol/week: 0.0 oz    Comment: rarely couple times per yr  . Drug use: Yes    Frequency: 4.0 times per week    Types: Marijuana    Comment: Cut back; Last used 03/22/17.    No Known Allergies  Current Outpatient Medications  Medication Sig Dispense Refill  .  dicyclomine (BENTYL) 10 MG capsule Take 1 capsule (10 mg total) by mouth 3 (three) times daily as needed for spasms. 90 capsule 0  . DULoxetine (CYMBALTA) 30 MG capsule Take 30 mg by mouth daily.     Marland Kitchen HYDROcodone-acetaminophen (NORCO/VICODIN) 5-325 MG tablet One tablet every four hours as needed for acute pain.  Limit of five days per [place]. 30 tablet 0  . naproxen (NAPROSYN) 500 MG tablet Take 1 tablet (500 mg total) by mouth 2 (two) times daily with a meal. 60 tablet 5  . ondansetron (ZOFRAN) 4 MG tablet Take 1 tablet (4 mg total) by mouth every 8 (eight) hours as needed for nausea or vomiting. 30 tablet 1  . traMADol (ULTRAM) 50 MG tablet Take by mouth.     No current facility-administered medications for this visit.      Physical Exam  Blood pressure 134/77, pulse 71, temperature 98.1 F (36.7 C), height 5\' 5"  (1.651 m), weight 174 lb (78.9 kg).  Constitutional: overall normal hygiene, normal nutrition, well developed, normal grooming, normal body habitus. Assistive device:none  Musculoskeletal: gait and station Limp none, muscle tone and strength are normal, no tremors or atrophy is present.  .  Neurological: coordination overall normal.  Deep tendon reflex/nerve stretch intact.  Sensation normal.  Cranial nerves II-XII intact.   Skin:   Normal overall no scars, lesions, ulcers or rashes. No psoriasis.  Psychiatric: Alert and oriented x 3.  Recent memory intact, remote memory unclear.  Normal mood and affect. Well groomed.  Good eye contact.  Cardiovascular: overall no swelling, no varicosities, no edema bilaterally, normal temperatures of the legs and arms, no clubbing, cyanosis and good capillary refill.  Lymphatic: palpation is normal.  Right hip has full motion and no pain.  NV intact.  Spine/Pelvis examination:  Inspection:  Overall, sacoiliac joint benign and hips nontender; without crepitus or defects.   Thoracic spine inspection: Alignment normal without  kyphosis present   Lumbar spine inspection:  Alignment  with normal lumbar lordosis, without scoliosis apparent.   Thoracic spine palpation:  without tenderness of spinal processes   Lumbar spine palpation: without tenderness of lumbar area; without tightness of lumbar muscles    Range of Motion:   Lumbar flexion, forward flexion is normal without pain or tenderness    Lumbar extension is full without pain or tenderness   Left lateral bend is normal without pain or tenderness   Right lateral bend is normal without pain or tenderness   Straight leg raising is normal  Strength & tone: normal   Stability overall normal stability All other systems reviewed and are negative   The patient has been educated about the nature of the problem(s)  and counseled on treatment options.  The patient appeared to understand what I have discussed and is in agreement with it.  Encounter Diagnoses  Name Primary?  . Chronic right-sided low back pain with right-sided sciatica Yes  . Hip pain, right    X-rays were done of the lumbar spine and right hip, reported separately.  PLAN Call if any problems.  Precautions discussed.  Begin Naprosyn and pain medicine.  I have reviewed the [facility] web site prior to prescribing narcotic medicine for this patient.  Return to clinic 1 month   Begin PT [place].  Call if any problem.  Precautions discussed.   Electronically Signed Sanjuana Kava, MD 2/19/20192:31 PM

## 2018-02-22 ENCOUNTER — Ambulatory Visit: Payer: Medicare Other | Admitting: Orthopedic Surgery

## 2018-03-06 ENCOUNTER — Encounter: Payer: Self-pay | Admitting: Orthopedic Surgery

## 2018-03-06 ENCOUNTER — Ambulatory Visit (INDEPENDENT_AMBULATORY_CARE_PROVIDER_SITE_OTHER): Payer: Medicare Other | Admitting: Orthopedic Surgery

## 2018-03-06 VITALS — BP 124/84 | HR 89 | Ht 65.0 in | Wt 173.0 lb

## 2018-03-06 DIAGNOSIS — M5441 Lumbago with sciatica, right side: Secondary | ICD-10-CM | POA: Diagnosis not present

## 2018-03-06 DIAGNOSIS — G8929 Other chronic pain: Secondary | ICD-10-CM | POA: Diagnosis not present

## 2018-03-06 NOTE — Progress Notes (Signed)
Progress Note   Patient ID: Donna Ramirez, female   DOB: Aug 25, 1971, 47 y.o.   MRN: [id number]  Chief Complaint  Patient presents with  . Back Pain    right sided low back pain     47 year old female comes in for follow-up currently on hydrocodone naproxen for right lower back pain.  X-rays in the office have been normal for the right hip and I reviewed those again today and agree that there is no arthritis in the hip  She continues to have right lower back pain with catching sensation she is scheduled for an injection tomorrow    Review of Systems  Gastrointestinal: Negative.   Genitourinary: Negative.    Current Meds  Medication Sig  . buPROPion (WELLBUTRIN XL) 150 MG 24 hr tablet   . DULoxetine (CYMBALTA) 30 MG capsule Take 30 mg by mouth daily.   Marland Kitchen HYDROcodone-acetaminophen (NORCO/VICODIN) 5-325 MG tablet One tablet every four hours as needed for acute pain.  Limit of five days per [employer] [place].  . naproxen (NAPROSYN) 500 MG tablet Take 1 tablet (500 mg total) by mouth 2 (two) times daily with a meal.  . ondansetron (ZOFRAN) 4 MG tablet Take 1 tablet (4 mg total) by mouth every 8 (eight) hours as needed for nausea or vomiting.  . sertraline (ZOLOFT) 100 MG tablet   . traMADol (ULTRAM) 50 MG tablet Take by mouth.    No Known Allergies   BP 124/84   Pulse 89   Ht 5\' 5"  (1.651 m)   Wt 173 lb (78.5 kg)   BMI 28.79 kg/m   Physical Exam  Constitutional: She is oriented to person, place, and time. She appears well-developed and well-nourished.  Musculoskeletal:       Legs: Neurological: She is alert and oriented to person, place, and time.  Psychiatric: She has a normal mood and affect. Judgment normal.  Vitals reviewed.  Pulse and perfusion normal both lower extremities  Medical decision-making Encounter Diagnosis  Name Primary?  . Chronic right-sided low back pain with right-sided sciatica Yes    Recommend she go ahead and get the injection and if she does  not improve by Friday we cannot and or order MRI of her hip     Arther Abbott, MD 03/06/2018 3:44 PM

## 2018-03-06 NOTE — Patient Instructions (Signed)
Call later in the week, if injection your primary care orders did not help, and we will proceed with MRI if you would like.

## 2018-03-08 ENCOUNTER — Telehealth: Payer: Self-pay | Admitting: Orthopedic Surgery

## 2018-03-08 DIAGNOSIS — M5136 Other intervertebral disc degeneration, lumbar region: Secondary | ICD-10-CM

## 2018-03-08 NOTE — Telephone Encounter (Signed)
Donna Ramirez called to let Dr. Aline Brochure know that she went to her PCP and he did not feel it was her hip either, so he did do the injection. She is asking if Dr. Aline Brochure can go ahead and order the MRI.  Please call and advise

## 2018-03-13 ENCOUNTER — Ambulatory Visit (HOSPITAL_COMMUNITY): Payer: Medicare Other

## 2018-03-14 ENCOUNTER — Ambulatory Visit (HOSPITAL_COMMUNITY)
Admission: RE | Admit: 2018-03-14 | Discharge: 2018-03-14 | Disposition: A | Discharge status: Home / Self Care | Payer: Medicare Other | Source: Ambulatory Visit | Attending: Orthopedic Surgery | Admitting: Orthopedic Surgery

## 2018-03-14 DIAGNOSIS — M5136 Other intervertebral disc degeneration, lumbar region: Secondary | ICD-10-CM

## 2018-03-17 ENCOUNTER — Ambulatory Visit (HOSPITAL_COMMUNITY)
Admission: RE | Admit: 2018-03-17 | Discharge: 2018-03-17 | Disposition: A | Discharge status: Home / Self Care | Payer: Medicare Other | Source: Ambulatory Visit | Attending: Orthopedic Surgery | Admitting: Orthopedic Surgery

## 2018-03-17 DIAGNOSIS — M5137 Other intervertebral disc degeneration, lumbosacral region: Secondary | ICD-10-CM | POA: Insufficient documentation

## 2018-03-17 DIAGNOSIS — M5136 Other intervertebral disc degeneration, lumbar region: Secondary | ICD-10-CM | POA: Insufficient documentation

## 2018-03-23 ENCOUNTER — Telehealth: Payer: Self-pay | Admitting: Orthopedic Surgery

## 2018-03-23 NOTE — Telephone Encounter (Signed)
Patient called to inquire about MRI results (her MRI had been re-scheduled x2, and no follow up appointment re-schedule yet.  Asking if she may receive results over phone?  Please advise. Ph [phone]

## 2018-03-23 NOTE — Telephone Encounter (Signed)
yes

## 2018-03-28 NOTE — Telephone Encounter (Signed)
Thank you :)

## 2018-07-04 ENCOUNTER — Encounter (HOSPITAL_COMMUNITY): Payer: Self-pay | Admitting: Emergency Medicine

## 2018-07-04 ENCOUNTER — Other Ambulatory Visit: Payer: Self-pay

## 2018-07-04 ENCOUNTER — Emergency Department (HOSPITAL_COMMUNITY)
Admission: EM | Admit: 2018-07-04 | Discharge: 2018-07-04 | Disposition: A | Discharge status: Home / Self Care | Payer: Medicare Other | Attending: Emergency Medicine | Admitting: Emergency Medicine

## 2018-07-04 ENCOUNTER — Emergency Department (HOSPITAL_COMMUNITY): Payer: Medicare Other

## 2018-07-04 DIAGNOSIS — Z79899 Other long term (current) drug therapy: Secondary | ICD-10-CM | POA: Insufficient documentation

## 2018-07-04 DIAGNOSIS — F419 Anxiety disorder, unspecified: Secondary | ICD-10-CM | POA: Insufficient documentation

## 2018-07-04 DIAGNOSIS — G44319 Acute post-traumatic headache, not intractable: Secondary | ICD-10-CM

## 2018-07-04 DIAGNOSIS — Z8572 Personal history of non-Hodgkin lymphomas: Secondary | ICD-10-CM | POA: Diagnosis not present

## 2018-07-04 DIAGNOSIS — F1210 Cannabis abuse, uncomplicated: Secondary | ICD-10-CM | POA: Insufficient documentation

## 2018-07-04 DIAGNOSIS — G44309 Post-traumatic headache, unspecified, not intractable: Secondary | ICD-10-CM | POA: Insufficient documentation

## 2018-07-04 DIAGNOSIS — Z87891 Personal history of nicotine dependence: Secondary | ICD-10-CM | POA: Diagnosis not present

## 2018-07-04 DIAGNOSIS — F329 Major depressive disorder, single episode, unspecified: Secondary | ICD-10-CM | POA: Insufficient documentation

## 2018-07-04 DIAGNOSIS — R51 Headache: Secondary | ICD-10-CM | POA: Diagnosis present

## 2018-07-04 MED ORDER — KETOROLAC TROMETHAMINE 30 MG/ML IJ SOLN
15.0000 mg | Freq: Once | INTRAMUSCULAR | Status: AC
  Administered 2018-07-04: 15 mg via INTRAMUSCULAR
  Filled 2018-07-04: qty 1

## 2018-07-04 NOTE — ED Provider Notes (Signed)
[facility] EMERGENCY DEPARTMENT Provider Note   CSN: [id number] Arrival date & time: 07/04/18  1101     History   Chief Complaint Chief Complaint  Patient presents with  . Headache    HPI Donna Ramirez is a 47 y.o. female.  HPI Presents with concern of headache. Headache began about 3 weeks ago after accident.  She recalls an episode of syncope, falling, striking her head. Since that time she has had dizziness, diffuse headache, unsteadiness, without relief from OTC medication. She was seen by primary care, has not had imaging or testing performed. Today with persistent dizziness, headache, she presents for evaluation. She denies asymmetric weakness, confusion, disorientation. She states that she feels generally unwell, notes that she is scheduled to follow-up with cardiology for Holter monitoring later this month.  Past Medical History:  Diagnosis Date  . Abdominal wall pain   . Anxiety   . Chronic back pain   . DDD (degenerative disc disease)   . Depression   . Duodenitis   . Fatty liver   . Fibroid, uterine   . Hearing loss   . Marijuana abuse   . Non Hodgkin's lymphoma ([place]) 2011    Patient Active Problem List   Diagnosis Date Noted  . Third degree hemorrhoids   . Dyspepsia 04/01/2016  . Hematochezia 04/01/2016  . Midline low back pain without sciatica 01/09/2016  . Dysphagia 04/13/2012  . Schatzki's ring 04/13/2012  . CONSTIPATION 11/09/2010  . Nausea with vomiting 08/18/2010  . Abdominal pain 08/18/2010  . THROMBOCYTOPENIA 07/29/2010  . NON-HODGKIN'S LYMPHOMA 07/28/2010  . EOSINOPHILIA 07/28/2010  . ABDOMINAL PAIN, EPIGASTRIC 07/28/2010  . DUODENAL DIVERTICULUM 05/14/2008  . DEGENERATIVE DISC DISEASE 05/07/2008    Past Surgical History:  Procedure Laterality Date  . ABDOMINAL HYSTERECTOMY  2008   complete  . COLONOSCOPY  07/31/10   Rourk-hyperplastic polyp  . COLONOSCOPY WITH PROPOFOL N/A 04/26/2016   Procedure: COLONOSCOPY WITH PROPOFOL;   Surgeon: Daneil Dolin, MD;  [facility];  Service: Endoscopy;  Laterality: N/A;  1015 - moved to 9:30 - office to notify  . ESOPHAGOGASTRODUODENOSCOPY  08/19/10   Rourk-noncritical schatzki ring, abnormal and pillow   . ESOPHAGOGASTRODUODENOSCOPY  04/2008   Rourk-mild erosive reflux esophagitis, noncritical Schatzki's ring status post dilation, 1.5 cm pedunculated mass distal D2  . ESOPHAGOGASTRODUODENOSCOPY  10/09   [facility]-normal  . ESOPHAGOGASTRODUODENOSCOPY (EGD) WITH PROPOFOL N/A 04/26/2016   Procedure: ESOPHAGOGASTRODUODENOSCOPY (EGD) WITH PROPOFOL;  Surgeon: Daneil Dolin, MD;  Location[facility];  Service: Endoscopy;  Laterality: N/A;  . EUS  6/09   Jacobs-6-8 MM nodule seen with recent mucosal biopsy 2-3 cm distal to the major papilla, biopsy benign, 1 cm or adenomatous-appearing mucus at the major papilla biopsy (focal mild inflammation associated with slight atypia)  . LYMPHADENECTOMY  2011  . MALONEY DILATION N/A 04/26/2016   Procedure: Venia Minks DILATION;  Surgeon: Daneil Dolin, MD;  Location: [facility];  Service: Endoscopy;  Laterality: N/A;     OB History   None      Home Medications    Prior to Admission medications   Medication Sig Start Date End Date Taking? Authorizing Provider  buPROPion (WELLBUTRIN XL) 150 MG 24 hr tablet 150 mg daily.  02/01/18  Yes [provider]  sertraline (ZOLOFT) 100 MG tablet 100 mg daily.  02/04/18  Yes [provider]  traMADol (ULTRAM) 50 MG tablet Take 200 mg by mouth daily.  07/01/11  Yes [provider]  dicyclomine (BENTYL) 10 MG capsule Take 1 capsule (10 mg total) by mouth 3 (three) times daily as needed for spasms. Patient not taking: Reported on 03/06/2018 03/23/17   Carlis Stable, NP  HYDROcodone-acetaminophen (NORCO/VICODIN) 5-325 MG tablet One tablet every four hours as needed for acute pain.  Limit of five days per  statue. Patient not taking: Reported on 07/04/2018 01/24/18    Sanjuana Kava, MD  naproxen (NAPROSYN) 500 MG tablet Take 1 tablet (500 mg total) by mouth 2 (two) times daily with a meal. Patient not taking: Reported on 07/04/2018 01/24/18   Sanjuana Kava, MD  ondansetron (ZOFRAN) 4 MG tablet Take 1 tablet (4 mg total) by mouth every 8 (eight) hours as needed for nausea or vomiting. Patient not taking: Reported on 07/04/2018 03/23/17   Carlis Stable, NP    Family History Family History  Problem Relation Age of Onset  . Diabetes Mother   . Breast cancer Mother   . Hypertension Other   . Hyperlipidemia Other   . Cancer Other   . Colon polyps Father 34       frequent polyps  . Colon cancer Paternal Grandmother        also paternal great uncle    Social History Social History   Tobacco Use  . Smoking status: Former Smoker    Packs/day: 1.00    Years: 25.00    Pack years: 25.00    Types: Cigarettes    Last attempt to quit: 10/04/2017    Years since quitting: 0.7  . Smokeless tobacco: Never Used  Substance Use Topics  . Alcohol use: Yes    Alcohol/week: 0.0 oz    Comment: rarely couple times per yr  . Drug use: Yes    Frequency: 4.0 times per week    Types: Marijuana    Comment: Cut back; Last used 03/22/17.     Allergies   Patient has no known allergies.   Review of Systems Review of Systems  Constitutional:       Per HPI, otherwise negative  HENT:       Per HPI, otherwise negative  Respiratory:       Per HPI, otherwise negative  Cardiovascular:       Per HPI, otherwise negative  Gastrointestinal: Positive for nausea. Negative for vomiting.  Endocrine:       Negative aside from HPI  Genitourinary:       Neg aside from HPI   Musculoskeletal:       Per HPI, otherwise negative  Skin: Negative.   Neurological: Positive for syncope.     Physical Exam Updated Vital Signs BP 110/79   Pulse 81   Temp 98.1 F (36.7 C) (Oral)   Resp 20   Ht 5\' 5"  (1.651 m)   Wt 82.1 kg (181 lb)   SpO2 99%   BMI 30.12 kg/m   Physical  Exam  Constitutional: She is oriented to person, place, and time. She appears well-developed and well-nourished. No distress.  HENT:  Head: Normocephalic and atraumatic.  Eyes: Conjunctivae and EOM are normal.  Neck:    Cardiovascular: Normal rate and regular rhythm.  Pulmonary/Chest: Effort normal and breath sounds normal. No stridor. No respiratory distress.  Abdominal: She exhibits no distension.  Musculoskeletal: She exhibits no edema.  Neurological: She is alert and oriented to person, place, and time. She displays no atrophy and no tremor. No cranial nerve deficit. She exhibits normal muscle tone. She displays no seizure activity.  Skin: Skin is warm and dry.  Psychiatric: She has a normal mood and affect.  Nursing note and vitals reviewed.    ED Treatments / Results   Radiology Ct Head Wo Contrast  Result Date: 07/04/2018 CLINICAL DATA:  Syncopal episode July 4th striking RIGHT side of head on ground, headache, neck pain and dizziness since fall, former smoker, history of non-Hodgkin lymphoma 2011 EXAM: CT HEAD WITHOUT CONTRAST TECHNIQUE: Contiguous axial images were obtained from the base of the skull through the vertex without intravenous contrast. Sagittal and coronal MPR images reconstructed from axial data set. COMPARISON:  10/05/2010 FINDINGS: Brain: Normal ventricular morphology. No midline shift or mass effect. Normal appearance of brain parenchyma. No intracranial hemorrhage, mass lesion, evidence of acute infarction, or extra-axial fluid collection. Vascular: No hyperdense vessels Skull: Intact Sinuses/Orbits: Clear Other: N/A IMPRESSION: Normal exam. Electronically Signed   By: Lavonia Dana M.D.   On: 07/04/2018 13:21    Procedures Procedures (including critical care time)  Medications Ordered in ED Medications  ketorolac (TORADOL) 30 MG/ML injection 15 mg (15 mg Intramuscular Given 07/04/18 1245)     Initial Impression / Assessment and Plan / ED Course  I have  reviewed the triage vital signs and the nursing notes.  Pertinent labs & imaging results that were available during my care of the patient were reviewed by me and considered in my medical decision making (see chart for details).  Patient presents about 3 weeks after posttraumatic headache onset, with persistent mild unsettled sensation, headache, dizziness, concern for intercranial pathology. Evaluation here generally reassuring, with normal neurologic exam, reassuring head CT, some suspicion for postconcussion effects. No other acute findings, vitals unremarkable, patient discharged in stable condition.  Final Clinical Impressions(s) / ED Diagnoses  Posttraumatic headache    Carmin Muskrat, MD 07/04/18 1334

## 2018-07-04 NOTE — Discharge Instructions (Addendum)
As discussed, your evaluation today has been largely reassuring.  But, it is important that you monitor your condition carefully, and do not hesitate to return to the ED if you develop new, or concerning changes in your condition. ? ?Otherwise, please follow-up with your physician for appropriate ongoing care. ? ?

## 2018-07-04 NOTE — ED Triage Notes (Addendum)
Patient states she had syncopal episode on July 4 and hit the right side of her head. Complaining of headache, neck pain, and dizziness since fall. States she went to PCP for treatment of syncopal episode.

## 2018-10-10 ENCOUNTER — Other Ambulatory Visit (HOSPITAL_COMMUNITY): Payer: Self-pay | Admitting: Family Medicine

## 2018-10-10 DIAGNOSIS — Z1231 Encounter for screening mammogram for malignant neoplasm of breast: Secondary | ICD-10-CM

## 2018-10-23 ENCOUNTER — Ambulatory Visit (HOSPITAL_COMMUNITY)
Admission: RE | Admit: 2018-10-23 | Discharge: 2018-10-23 | Disposition: A | Discharge status: Home / Self Care | Payer: Medicare Other | Source: Ambulatory Visit | Attending: Family Medicine | Admitting: Family Medicine

## 2018-10-23 ENCOUNTER — Encounter (HOSPITAL_COMMUNITY): Payer: Self-pay

## 2018-10-23 DIAGNOSIS — Z1231 Encounter for screening mammogram for malignant neoplasm of breast: Secondary | ICD-10-CM | POA: Diagnosis present

## 2018-11-14 ENCOUNTER — Telehealth: Payer: Self-pay | Admitting: Orthopedic Surgery

## 2018-11-14 NOTE — Telephone Encounter (Signed)
Call received from patient, states same pain radiating around from back; asking about possible appointment with Dr Aline Brochure. States has had the MRI back in April, per Dr Aline Brochure, and has seen her primary care recently. Donna Ramirez has appointment on Friday, 11/17/18, with PCP; will ask for referral.  Aware it will be after 1st of year.

## 2018-11-24 ENCOUNTER — Encounter: Payer: Self-pay | Admitting: Internal Medicine

## 2018-12-22 ENCOUNTER — Telehealth: Payer: Self-pay | Admitting: Gastroenterology

## 2018-12-22 ENCOUNTER — Ambulatory Visit (INDEPENDENT_AMBULATORY_CARE_PROVIDER_SITE_OTHER): Payer: Medicaid Other | Admitting: Gastroenterology

## 2018-12-22 ENCOUNTER — Encounter: Payer: Self-pay | Admitting: Gastroenterology

## 2018-12-22 VITALS — BP 116/73 | HR 82 | Temp 97.9°F | Ht 65.0 in | Wt 195.8 lb

## 2018-12-22 DIAGNOSIS — R1319 Other dysphagia: Secondary | ICD-10-CM

## 2018-12-22 DIAGNOSIS — R1310 Dysphagia, unspecified: Secondary | ICD-10-CM | POA: Diagnosis not present

## 2018-12-22 NOTE — Telephone Encounter (Signed)
Please let patient know that I was reviewing her chart after her office visit today.  Given her family history of colon polyps, father and 2 family members with colon cancer her next colonoscopy should be in 2022 (5-year follow-up).  Please NIC for colonoscopy in May 2022.

## 2018-12-22 NOTE — Progress Notes (Signed)
Primary Care Physician:  Ardine Eng, MD  Primary Gastroenterologist:  Garfield Cornea, MD   Chief Complaint  Patient presents with  . Dysphagia    food/liquids    HPI:  Donna Ramirez is a 48 y.o. female here for further evaluation of dysphagia at the request of Dr Ellard Artis Ashkin. Referral also had Hep C but patient denies history of Hep C.  Risk factors include tattoos.  Patient reports previous EGD with dilation was helpful. Started having dysphagia to solid foods again about a year ago. Progressively symptoms have gotten worse. Has pressure in lower chest when food sticks, eventually goes down. No vomiting. She has woken up in middle of night and couldn't swallow. Occasionally gets choked on liquids, associated with coughing. She has not been on a PPI but she believes she was recently started on one. She will call with updated medication list. BMs ok. No melena, brbpr.   Skippy was in 2017 when she had grade 3 hemorrhoids.  Father had colon polyps at age less than 66, paternal grandmother and paternal great uncle both had colon cancer.  Would recommend 5-year surveillance colonoscopy for patient based on guidelines.  Current Outpatient Medications  Medication Sig Dispense Refill  . atorvastatin (LIPITOR) 40 MG tablet Take 1 tablet by mouth daily.    Marland Kitchen buPROPion (WELLBUTRIN XL) 150 MG 24 hr tablet 150 mg daily.     . hydrOXYzine (ATARAX/VISTARIL) 50 MG tablet Take 1 tablet by mouth 3 (three) times daily.    . ondansetron (ZOFRAN) 4 MG tablet Take 1 tablet (4 mg total) by mouth every 8 (eight) hours as needed for nausea or vomiting. 30 tablet 1  . sertraline (ZOLOFT) 100 MG tablet 100 mg daily.     . traMADol (ULTRAM) 50 MG tablet Take 200 mg by mouth as needed.      No current facility-administered medications for this visit.     Allergies as of 12/22/2018  . (No Known Allergies)    Past Medical History:  Diagnosis Date  . Abdominal wall pain   . Anxiety   . Chronic back pain    . DDD (degenerative disc disease)   . Depression   . Duodenitis   . Fatty liver   . Fibroid, uterine   . Hearing loss   . Marijuana abuse   . Non Hodgkin's lymphoma ([place]) 2011    Past Surgical History:  Procedure Laterality Date  . ABDOMINAL HYSTERECTOMY  2008   complete  . BREAST EXCISIONAL BIOPSY Bilateral 2012   lymphoma  . COLONOSCOPY  07/31/10   Rourk-hyperplastic polyp  . COLONOSCOPY WITH PROPOFOL N/A 04/26/2016   Procedure: COLONOSCOPY WITH PROPOFOL;  Surgeon: Daneil Dolin, MD;  [facility];  Service: Endoscopy;  Laterality: N/A;  1015 - moved to 9:30 - office to notify  . ESOPHAGOGASTRODUODENOSCOPY  08/19/10   Rourk-noncritical schatzki ring, abnormal and pillow   . ESOPHAGOGASTRODUODENOSCOPY  04/2008   Rourk-mild erosive reflux esophagitis, noncritical Schatzki's ring status post dilation, 1.5 cm pedunculated mass distal D2  . ESOPHAGOGASTRODUODENOSCOPY  10/09   [facility]-normal  . ESOPHAGOGASTRODUODENOSCOPY (EGD) WITH PROPOFOL N/A 04/26/2016   Procedure: ESOPHAGOGASTRODUODENOSCOPY (EGD) WITH PROPOFOL;  Surgeon: Daneil Dolin, MD;  [facility];  Service: Endoscopy;  Laterality: N/A;  . EUS  6/09   Jacobs-6-8 MM nodule seen with recent mucosal biopsy 2-3 cm distal to the major papilla, biopsy benign, 1 cm or adenomatous-appearing mucus at the major papilla biopsy (focal mild inflammation associated  with slight atypia)  . LYMPHADENECTOMY  2011  . MALONEY DILATION N/A 04/26/2016   Procedure: Venia Minks DILATION;  Surgeon: Daneil Dolin, MD;  [facility];  Service: Endoscopy;  Laterality: N/A;    Family History  Problem Relation Age of Onset  . Diabetes Mother   . Breast cancer Mother   . Hypertension Other   . Hyperlipidemia Other   . Cancer Other   . Colon polyps Father 88       frequent polyps  . Colon cancer Paternal Grandmother        also paternal great uncle    Social History   Socioeconomic History  . Marital status: Single     Spouse name: Not on file  . Number of children: 0  . Years of education: Not on file  . Highest education level: Not on file  Occupational History  . Occupation: disabled    Fish farm manager: UNEMPLOYED  Social Needs  . Financial resource strain: Not on file  . Food insecurity:    Worry: Not on file    Inability: Not on file  . Transportation needs:    Medical: Not on file    Non-medical: Not on file  Tobacco Use  . Smoking status: Former Smoker    Packs/day: 1.00    Years: 25.00    Pack years: 25.00    Types: Cigarettes    Last attempt to quit: 10/04/2017    Years since quitting: 1.2  . Smokeless tobacco: Never Used  Substance and Sexual Activity  . Alcohol use: Not Currently    Alcohol/week: 0.0 standard drinks    Comment: rarely couple times per yr  . Drug use: Yes    Frequency: 4.0 times per week    Types: Marijuana  . Sexual activity: Never    Birth control/protection: Surgical  Lifestyle  . Physical activity:    Days per week: Not on file    Minutes per session: Not on file  . Stress: Not on file  Relationships  . Social connections:    Talks on phone: Not on file    Gets together: Not on file    Attends religious service: Not on file    Active member of club or organization: Not on file    Attends meetings of clubs or organizations: Not on file    Relationship status: Not on file  . Intimate partner violence:    Fear of current or ex partner: Not on file    Emotionally abused: Not on file    Physically abused: Not on file    Forced sexual activity: Not on file  Other Topics Concern  . Not on file  Social History Narrative  . Not on file      ROS:  General: Negative for anorexia, weight loss, fever, chills, fatigue, weakness. Eyes: Negative for vision changes.  ENT: Negative for hoarseness, nasal congestion. See hpi CV: Negative for chest pain, angina, palpitations, dyspnea on exertion, peripheral edema.  Respiratory: Negative for dyspnea at rest,  dyspnea on exertion, cough, sputum, wheezing.  GI: See history of present illness. GU:  Negative for dysuria, hematuria, urinary incontinence, urinary frequency, nocturnal urination.  MS: Negative for joint pain, low back pain.  Derm: Negative for rash or itching.  Neuro: Negative for weakness, abnormal sensation, seizure, frequent headaches, memory loss, confusion.  Psych: Negative for anxiety, depression, suicidal ideation, hallucinations.  Endo: Negative for unusual weight change.  Heme: Negative for bruising or bleeding. Allergy: Negative for  rash or hives.    Physical Examination:  BP 116/73   Pulse 82   Temp 97.9 F (36.6 C) (Oral)   Ht 5\' 5"  (1.651 m)   Wt 195 lb 12.8 oz (88.8 kg)   BMI 32.58 kg/m    General: Well-nourished, well-developed in no acute distress.  Head: Normocephalic, atraumatic.   Eyes: Conjunctiva pink, no icterus. Mouth: Oropharyngeal mucosa moist and pink , no lesions erythema or exudate. Neck: Supple without thyromegaly, masses, or lymphadenopathy.  Lungs: Clear to auscultation bilaterally.  Heart: Regular rate and rhythm, no murmurs rubs or gallops.  Abdomen: Bowel sounds are normal, nontender, nondistended, no hepatosplenomegaly or masses, no abdominal bruits or    hernia , no rebound or guarding.   Rectal: not performed Extremities: No lower extremity edema. No clubbing or deformities.  Neuro: Alert and oriented x 4 , grossly normal neurologically.  Skin: Warm and dry, no rash or jaundice.   Psych: Alert and cooperative, normal mood and affect.  Imaging Studies: No results found.

## 2018-12-22 NOTE — Assessment & Plan Note (Addendum)
Pleasant 48 y/o female with h/o Schatzki's ring s/p dilation in the past who presents with recurrent esophageal dysphagia to solids. Some vague issues with clear liquids, with coughing/strangling at times. Previously responded well with esophageal dilation. She denies typical heartburn. She has not been on PPI. She may have recently started one and will call with updated med list next week. Plan on EGD/ED in near future with deep sedation given polypharmacy.  I have discussed the risks, alternatives, benefits with regards to but not limited to the risk of reaction to medication, bleeding, infection, perforation and the patient is agreeable to proceed. Written consent to be obtained.  Will touch base with PCP regarding HCV referral. Patient denies h/o HCV.

## 2018-12-22 NOTE — Telephone Encounter (Signed)
Sending to [place] to [place].

## 2018-12-22 NOTE — H&P (View-Only) (Signed)
Primary Care Physician:  Ardine Eng, MD  Primary Gastroenterologist:  Garfield Cornea, MD   Chief Complaint  Patient presents with  . Dysphagia    food/liquids    HPI:  Donna Ramirez is a 48 y.o. female here for further evaluation of dysphagia at the request of Dr Ellard Artis Ashkin. Referral also had Hep C but patient denies history of Hep C.  Risk factors include tattoos.  Patient reports previous EGD with dilation was helpful. Started having dysphagia to solid foods again about a year ago. Progressively symptoms have gotten worse. Has pressure in lower chest when food sticks, eventually goes down. No vomiting. She has woken up in middle of night and couldn't swallow. Occasionally gets choked on liquids, associated with coughing. She has not been on a PPI but she believes she was recently started on one. She will call with updated medication list. BMs ok. No melena, brbpr.   Skippy was in 2017 when she had grade 3 hemorrhoids.  Father had colon polyps at age less than 59, paternal grandmother and paternal great uncle both had colon cancer.  Would recommend 5-year surveillance colonoscopy for patient based on guidelines.  Current Outpatient Medications  Medication Sig Dispense Refill  . atorvastatin (LIPITOR) 40 MG tablet Take 1 tablet by mouth daily.    Marland Kitchen buPROPion (WELLBUTRIN XL) 150 MG 24 hr tablet 150 mg daily.     . hydrOXYzine (ATARAX/VISTARIL) 50 MG tablet Take 1 tablet by mouth 3 (three) times daily.    . ondansetron (ZOFRAN) 4 MG tablet Take 1 tablet (4 mg total) by mouth every 8 (eight) hours as needed for nausea or vomiting. 30 tablet 1  . sertraline (ZOLOFT) 100 MG tablet 100 mg daily.     . traMADol (ULTRAM) 50 MG tablet Take 200 mg by mouth as needed.      No current facility-administered medications for this visit.     Allergies as of 12/22/2018  . (No Known Allergies)    Past Medical History:  Diagnosis Date  . Abdominal wall pain   . Anxiety   . Chronic back pain    . DDD (degenerative disc disease)   . Depression   . Duodenitis   . Fatty liver   . Fibroid, uterine   . Hearing loss   . Marijuana abuse   . Non Hodgkin's lymphoma ([place]) 2011    Past Surgical History:  Procedure Laterality Date  . ABDOMINAL HYSTERECTOMY  2008   complete  . BREAST EXCISIONAL BIOPSY Bilateral 2012   lymphoma  . COLONOSCOPY  07/31/10   Rourk-hyperplastic polyp  . COLONOSCOPY WITH PROPOFOL N/A 04/26/2016   Procedure: COLONOSCOPY WITH PROPOFOL;  Surgeon: Daneil Dolin, MD;  Location: [facility];  Service: Endoscopy;  Laterality: N/A;  1015 - moved to 9:30 - office to notify  . ESOPHAGOGASTRODUODENOSCOPY  08/19/10   Rourk-noncritical schatzki ring, abnormal and pillow   . ESOPHAGOGASTRODUODENOSCOPY  04/2008   Rourk-mild erosive reflux esophagitis, noncritical Schatzki's ring status post dilation, 1.5 cm pedunculated mass distal D2  . ESOPHAGOGASTRODUODENOSCOPY  10/09   [facility]-normal  . ESOPHAGOGASTRODUODENOSCOPY (EGD) WITH PROPOFOL N/A 04/26/2016   Procedure: ESOPHAGOGASTRODUODENOSCOPY (EGD) WITH PROPOFOL;  Surgeon: Daneil Dolin, MD;  Location: [facility];  Service: Endoscopy;  Laterality: N/A;  . EUS  6/09   Jacobs-6-8 MM nodule seen with recent mucosal biopsy 2-3 cm distal to the major papilla, biopsy benign, 1 cm or adenomatous-appearing mucus at the major papilla biopsy (focal mild inflammation associated  with slight atypia)  . LYMPHADENECTOMY  2011  . MALONEY DILATION N/A 04/26/2016   Procedure: Venia Minks DILATION;  Surgeon: Daneil Dolin, MD;  Location: [facility];  Service: Endoscopy;  Laterality: N/A;    Family History  Problem Relation Age of Onset  . Diabetes Mother   . Breast cancer Mother   . Hypertension Other   . Hyperlipidemia Other   . Cancer Other   . Colon polyps Father 47       frequent polyps  . Colon cancer Paternal Grandmother        also paternal great uncle    Social History   Socioeconomic History  . Marital status: Single     Spouse name: Not on file  . Number of children: 0  . Years of education: Not on file  . Highest education level: Not on file  Occupational History  . Occupation: disabled    Fish farm manager: UNEMPLOYED  Social Needs  . Financial resource strain: Not on file  . Food insecurity:    Worry: Not on file    Inability: Not on file  . Transportation needs:    Medical: Not on file    Non-medical: Not on file  Tobacco Use  . Smoking status: Former Smoker    Packs/day: 1.00    Years: 25.00    Pack years: 25.00    Types: Cigarettes    Last attempt to quit: 10/04/2017    Years since quitting: 1.2  . Smokeless tobacco: Never Used  Substance and Sexual Activity  . Alcohol use: Not Currently    Alcohol/week: 0.0 standard drinks    Comment: rarely couple times per yr  . Drug use: Yes    Frequency: 4.0 times per week    Types: Marijuana  . Sexual activity: Never    Birth control/protection: Surgical  Lifestyle  . Physical activity:    Days per week: Not on file    Minutes per session: Not on file  . Stress: Not on file  Relationships  . Social connections:    Talks on phone: Not on file    Gets together: Not on file    Attends religious service: Not on file    Active member of club or organization: Not on file    Attends meetings of clubs or organizations: Not on file    Relationship status: Not on file  . Intimate partner violence:    Fear of current or ex partner: Not on file    Emotionally abused: Not on file    Physically abused: Not on file    Forced sexual activity: Not on file  Other Topics Concern  . Not on file  Social History Narrative  . Not on file      ROS:  General: Negative for anorexia, weight loss, fever, chills, fatigue, weakness. Eyes: Negative for vision changes.  ENT: Negative for hoarseness, nasal congestion. See hpi CV: Negative for chest pain, angina, palpitations, dyspnea on exertion, peripheral edema.  Respiratory: Negative for dyspnea at rest,  dyspnea on exertion, cough, sputum, wheezing.  GI: See history of present illness. GU:  Negative for dysuria, hematuria, urinary incontinence, urinary frequency, nocturnal urination.  MS: Negative for joint pain, low back pain.  Derm: Negative for rash or itching.  Neuro: Negative for weakness, abnormal sensation, seizure, frequent headaches, memory loss, confusion.  Psych: Negative for anxiety, depression, suicidal ideation, hallucinations.  Endo: Negative for unusual weight change.  Heme: Negative for bruising or bleeding. Allergy: Negative for  rash or hives.    Physical Examination:  BP 116/73   Pulse 82   Temp 97.9 F (36.6 C) (Oral)   Ht 5\' 5"  (1.651 m)   Wt 195 lb 12.8 oz (88.8 kg)   BMI 32.58 kg/m    General: Well-nourished, well-developed in no acute distress.  Head: Normocephalic, atraumatic.   Eyes: Conjunctiva pink, no icterus. Mouth: Oropharyngeal mucosa moist and pink , no lesions erythema or exudate. Neck: Supple without thyromegaly, masses, or lymphadenopathy.  Lungs: Clear to auscultation bilaterally.  Heart: Regular rate and rhythm, no murmurs rubs or gallops.  Abdomen: Bowel sounds are normal, nontender, nondistended, no hepatosplenomegaly or masses, no abdominal bruits or    hernia , no rebound or guarding.   Rectal: not performed Extremities: No lower extremity edema. No clubbing or deformities.  Neuro: Alert and oriented x 4 , grossly normal neurologically.  Skin: Warm and dry, no rash or jaundice.   Psych: Alert and cooperative, normal mood and affect.  Imaging Studies: No results found.

## 2018-12-22 NOTE — Patient Instructions (Signed)
1. Upper endoscopy as scheduled. See separate instructions.  2. Please call Monday or Tuesday and give me updated list of your medications. We will also request further records from PCP to make sure you don't have Hepatitis C as referral stated. If we can't determine this, we would recommend you be checked.

## 2018-12-25 ENCOUNTER — Telehealth: Payer: Self-pay | Admitting: *Deleted

## 2018-12-25 ENCOUNTER — Other Ambulatory Visit: Payer: Self-pay | Admitting: *Deleted

## 2018-12-25 DIAGNOSIS — R1319 Other dysphagia: Secondary | ICD-10-CM

## 2018-12-25 DIAGNOSIS — R1310 Dysphagia, unspecified: Secondary | ICD-10-CM

## 2018-12-25 NOTE — Telephone Encounter (Signed)
Spoke with patient egd/dil w/ MAC is scheduled for 01/18/2019 at 8:15am. Patient aware will call back with pre-op appt. Instructions mailed.

## 2018-12-25 NOTE — Progress Notes (Signed)
cc'd to pcp 

## 2018-12-25 NOTE — Telephone Encounter (Signed)
Pt notified and is ok with LSL recommendations.

## 2018-12-25 NOTE — Telephone Encounter (Signed)
ON RECALL FOR TCS °

## 2018-12-25 NOTE — Telephone Encounter (Signed)
Pre-op scheduled for 01/12/2019 at 12:45pm/ Pt aware. Letter mailed.

## 2019-01-12 ENCOUNTER — Other Ambulatory Visit (HOSPITAL_COMMUNITY): Payer: Medicare Other

## 2019-01-15 ENCOUNTER — Encounter (HOSPITAL_COMMUNITY): Payer: Self-pay

## 2019-01-16 ENCOUNTER — Encounter (HOSPITAL_COMMUNITY)
Admission: RE | Admit: 2019-01-16 | Discharge: 2019-01-16 | Disposition: A | Discharge status: Home / Self Care | Payer: Medicare Other | Source: Ambulatory Visit | Attending: Internal Medicine | Admitting: Internal Medicine

## 2019-01-17 ENCOUNTER — Telehealth: Payer: Self-pay | Admitting: Gastroenterology

## 2019-01-17 DIAGNOSIS — R109 Unspecified abdominal pain: Secondary | ICD-10-CM

## 2019-01-17 NOTE — Telephone Encounter (Signed)
Please let patient know that we need to get a Hep C antibody test done to sort out the issues of ?Hepc which was on her referral papers. We discussed this as being a potential option at time of her ov.

## 2019-01-17 NOTE — Telephone Encounter (Signed)
Noted. Pt is aware and lab orders were mailed.

## 2019-01-18 ENCOUNTER — Ambulatory Visit (HOSPITAL_COMMUNITY): Payer: Medicaid Other | Admitting: Anesthesiology

## 2019-01-18 ENCOUNTER — Encounter (HOSPITAL_COMMUNITY)
Admission: RE | Disposition: A | Discharge status: Home / Self Care | Payer: Self-pay | Source: Home / Self Care | Attending: Internal Medicine

## 2019-01-18 ENCOUNTER — Ambulatory Visit (HOSPITAL_COMMUNITY)
Admission: RE | Admit: 2019-01-18 | Discharge: 2019-01-18 | Disposition: A | Discharge status: Home / Self Care | Payer: Medicaid Other | Attending: Internal Medicine | Admitting: Internal Medicine

## 2019-01-18 ENCOUNTER — Encounter (HOSPITAL_COMMUNITY): Payer: Self-pay | Admitting: *Deleted

## 2019-01-18 DIAGNOSIS — Z87891 Personal history of nicotine dependence: Secondary | ICD-10-CM | POA: Diagnosis not present

## 2019-01-18 DIAGNOSIS — R1319 Other dysphagia: Secondary | ICD-10-CM

## 2019-01-18 DIAGNOSIS — G8929 Other chronic pain: Secondary | ICD-10-CM | POA: Diagnosis not present

## 2019-01-18 DIAGNOSIS — K449 Diaphragmatic hernia without obstruction or gangrene: Secondary | ICD-10-CM | POA: Diagnosis not present

## 2019-01-18 DIAGNOSIS — Z8371 Family history of colonic polyps: Secondary | ICD-10-CM | POA: Insufficient documentation

## 2019-01-18 DIAGNOSIS — Z800 Family history of malignant neoplasm of digestive organs: Secondary | ICD-10-CM | POA: Diagnosis not present

## 2019-01-18 DIAGNOSIS — F419 Anxiety disorder, unspecified: Secondary | ICD-10-CM | POA: Insufficient documentation

## 2019-01-18 DIAGNOSIS — M549 Dorsalgia, unspecified: Secondary | ICD-10-CM | POA: Insufficient documentation

## 2019-01-18 DIAGNOSIS — Z8572 Personal history of non-Hodgkin lymphomas: Secondary | ICD-10-CM | POA: Diagnosis not present

## 2019-01-18 DIAGNOSIS — Z79899 Other long term (current) drug therapy: Secondary | ICD-10-CM | POA: Diagnosis not present

## 2019-01-18 DIAGNOSIS — R1310 Dysphagia, unspecified: Secondary | ICD-10-CM | POA: Insufficient documentation

## 2019-01-18 DIAGNOSIS — F329 Major depressive disorder, single episode, unspecified: Secondary | ICD-10-CM | POA: Diagnosis not present

## 2019-01-18 HISTORY — PX: MALONEY DILATION: SHX5535

## 2019-01-18 HISTORY — PX: ESOPHAGOGASTRODUODENOSCOPY (EGD) WITH PROPOFOL: SHX5813

## 2019-01-18 SURGERY — ESOPHAGOGASTRODUODENOSCOPY (EGD) WITH PROPOFOL
Anesthesia: Monitor Anesthesia Care

## 2019-01-18 MED ORDER — LACTATED RINGERS IV SOLN
INTRAVENOUS | Status: DC
  Administered 2019-01-18: 08:00:00 via INTRAVENOUS

## 2019-01-18 MED ORDER — PROPOFOL 500 MG/50ML IV EMUL
INTRAVENOUS | Status: DC | PRN
  Administered 2019-01-18: 200 ug/kg/min via INTRAVENOUS

## 2019-01-18 MED ORDER — CHLORHEXIDINE GLUCONATE CLOTH 2 % EX PADS: 6.0000 | MEDICATED_PAD | Freq: Once | CUTANEOUS | Status: DC

## 2019-01-18 MED ORDER — PROPOFOL 10 MG/ML IV BOLUS
INTRAVENOUS | Status: AC
  Filled 2019-01-18: qty 20

## 2019-01-18 MED ORDER — PROPOFOL 10 MG/ML IV BOLUS
INTRAVENOUS | Status: DC | PRN
  Administered 2019-01-18 (×3): 20 mg via INTRAVENOUS

## 2019-01-18 MED ORDER — STERILE WATER FOR IRRIGATION IR SOLN
Status: DC | PRN
  Administered 2019-01-18: 08:00:00

## 2019-01-18 NOTE — Anesthesia Postprocedure Evaluation (Signed)
Anesthesia Post Note  Patient: Donna Ramirez  Procedure(s) Performed: ESOPHAGOGASTRODUODENOSCOPY (EGD) WITH PROPOFOL (N/A ) MALONEY DILATION (N/A )  Patient location during evaluation: PACU Anesthesia Type: MAC Level of consciousness: awake and alert and oriented Pain management: pain level controlled Vital Signs Assessment: post-procedure vital signs reviewed and stable Respiratory status: spontaneous breathing Cardiovascular status: blood pressure returned to baseline and stable Postop Assessment: no apparent nausea or vomiting Anesthetic complications: no     Last Vitals:  Vitals:   01/18/19 0726 01/18/19 0850  BP: (!) 100/55 110/69  Pulse: 77 72  Resp: 18 15  Temp: 36.7 C (P) 36.5 C  SpO2: 96% 94%    Last Pain:  Vitals:   01/18/19 0726  TempSrc: Oral  PainSc: 0-No pain                 Jacinta Penalver

## 2019-01-18 NOTE — Interval H&P Note (Signed)
History and Physical Interval Note:  01/18/2019 7:32 AM  Calina L Ramer  has presented today for surgery, with the diagnosis of dysphagia  The various methods of treatment have been discussed with the patient and family. After consideration of risks, benefits and other options for treatment, the patient has consented to  Procedure(s) with comments: ESOPHAGOGASTRODUODENOSCOPY (EGD) WITH PROPOFOL (N/A) - 8:15am MALONEY DILATION (N/A) as a surgical intervention .  The patient's history has been reviewed, patient examined, no change in status, stable for surgery.  I have reviewed the patient's chart and labs.  Questions were answered to the patient's satisfaction.     Raphaela Cannaday  No change.  EGD with ED as feasible/appropriate per plan.  The risks, benefits, limitations, alternatives and imponderables have been reviewed with the patient. Potential for esophageal dilation, biopsy, etc. have also been reviewed.  Questions have been answered. All parties agreeable.

## 2019-01-18 NOTE — Discharge Instructions (Signed)
EGD Discharge instructions Please read the instructions outlined below and refer to this sheet in the next few weeks. These discharge instructions provide you with general information on caring for yourself after you leave the hospital. Your doctor may also give you specific instructions. While your treatment has been planned according to the most current medical practices available, unavoidable complications occasionally occur. If you have any problems or questions after discharge, please call your doctor. ACTIVITY  You may resume your regular activity but move at a slower pace for the next 24 hours.   Take frequent rest periods for the next 24 hours.   Walking will help expel (get rid of) the air and reduce the bloated feeling in your abdomen.   No driving for 24 hours (because of the anesthesia (medicine) used during the test).   You may shower.   Do not sign any important legal documents or operate any machinery for 24 hours (because of the anesthesia used during the test).  NUTRITION  Drink plenty of fluids.   You may resume your normal diet.   Begin with a light meal and progress to your normal diet.   Avoid alcoholic beverages for 24 hours or as instructed by your caregiver.  MEDICATIONS  You may resume your normal medications unless your caregiver tells you otherwise.  WHAT YOU CAN EXPECT TODAY  You may experience abdominal discomfort such as a feeling of fullness or gas pains.  FOLLOW-UP  Your doctor will discuss the results of your test with you.  SEEK IMMEDIATE MEDICAL ATTENTION IF ANY OF THE FOLLOWING OCCUR:  Excessive nausea (feeling sick to your stomach) and/or vomiting.   Severe abdominal pain and distention (swelling).   Trouble swallowing.   Temperature over 101 F (37.8 C).   Rectal bleeding or vomiting of blood.    Esophageal Dilatation Esophageal dilatation, also called esophageal dilation, is a procedure to widen or open (dilate) a blocked or  narrowed part of the esophagus. The esophagus is the part of the body that moves food and liquid from the mouth to the stomach. You may need this procedure if:  You have a buildup of scar tissue in your esophagus that makes it difficult, painful, or impossible to swallow. This can be caused by gastroesophageal reflux disease (GERD).  You have cancer of the esophagus.  There is a problem with how food moves through your esophagus. In some cases, you may need this procedure repeated at a later time to dilate the esophagus gradually. Tell a health care provider about:  Any allergies you have.  All medicines you are taking, including vitamins, herbs, eye drops, creams, and over-the-counter medicines.  Any problems you or family members have had with anesthetic medicines.  Any blood disorders you have.  Any surgeries you have had.  Any medical conditions you have.  Any antibiotic medicines you are required to take before dental procedures.  Whether you are pregnant or may be pregnant. What are the risks? Generally, this is a safe procedure. However, problems may occur, including:  Bleeding due to a tear in the lining of the esophagus.  A hole (perforation) in the esophagus. What happens before the procedure?  Follow instructions from your health care provider about eating or drinking restrictions.  Ask your health care provider about changing or stopping your regular medicines. This is especially important if you are taking diabetes medicines or blood thinners.  Plan to have someone take you home from the hospital or clinic.  Plan to  have a responsible adult care for you for at least 24 hours after you leave the hospital or clinic. This is important. What happens during the procedure?  You may be given a medicine to help you relax (sedative).  A numbing medicine may be sprayed into the back of your throat, or you may gargle the medicine.  Your health care provider may perform  the dilatation using various surgical instruments, such as: ? Simple dilators. This instrument is carefully placed in the esophagus to stretch it. ? Guided wire bougies. This involves using an endoscope to insert a wire into the esophagus. A dilator is passed over this wire to enlarge the esophagus. Then the wire is removed. ? Balloon dilators. An endoscope with a small balloon at the end is inserted into the esophagus. The balloon is inflated to stretch the esophagus and open it up. The procedure may vary among health care providers and hospitals. What happens after the procedure?  Your blood pressure, heart rate, breathing rate, and blood oxygen level will be monitored until the medicines you were given have worn off.  Your throat may feel slightly sore and numb. This will improve slowly over time.  You will not be allowed to eat or drink until your throat is no longer numb.  When you are able to drink, urinate, and sit on the edge of the bed without nausea or dizziness, you may be able to return home. Follow these instructions at home:  Take over-the-counter and prescription medicines only as told by your health care provider.  Do not drive for 24 hours if you were given a sedative during your procedure.  You should have a responsible adult with you for 24 hours after the procedure.  Follow instructions from your health care provider about any eating or drinking restrictions.  Do not use any products that contain nicotine or tobacco, such as cigarettes and e-cigarettes. If you need help quitting, ask your health care provider.  Keep all follow-up visits as told by your health care provider. This is important. Get help right away if you:  Have a fever.  Have chest pain.  Have pain that is not relieved by medication.  Have trouble breathing.  Have trouble swallowing.  Vomit blood. Summary  Esophageal dilatation, also called esophageal dilation, is a procedure to widen or  open (dilate) a blocked or narrowed part of the esophagus.  Plan to have someone take you home from the hospital or clinic.  For this procedure, a numbing medicine may be sprayed into the back of your throat, or you may gargle the medicine.  Do not drive for 24 hours if you were given a sedative during your procedure. This information is not intended to replace advice given to you by your health care provider. Make sure you discuss any questions you have with your health care provider. Document Released: 01/13/2006 Document Revised: 09/27/2017 Document Reviewed: 09/27/2017 Elsevier Interactive Patient Education  2019 [employer], Care After These instructions provide you with information about caring for yourself after your procedure. Your health care provider may also give you more specific instructions. Your treatment has been planned according to current medical practices, but problems sometimes occur. Call your health care provider if you have any problems or questions after your procedure. What can I expect after the procedure? After your procedure, you may:  Feel sleepy for several hours.  Feel clumsy and have poor balance for several hours.  Feel forgetful about what  happened after the procedure.  Have poor judgment for several hours.  Feel nauseous or vomit.  Have a sore throat if you had a breathing tube during the procedure. Follow these instructions at home: For at least 24 hours after the procedure:      Have a responsible adult stay with you. It is important to have someone help care for you until you are awake and alert.  Rest as needed.  Do not: ? Participate in activities in which you could fall or become injured. ? Drive. ? Use heavy machinery. ? Drink alcohol. ? Take sleeping pills or medicines that cause drowsiness. ? Make important decisions or sign legal documents. ? Take care of children on your own. Eating and  drinking  Follow the diet that is recommended by your health care provider.  If you vomit, drink water, juice, or soup when you can drink without vomiting.  Make sure you have little or no nausea before eating solid foods. General instructions  Take over-the-counter and prescription medicines only as told by your health care provider.  If you have sleep apnea, surgery and certain medicines can increase your risk for breathing problems. Follow instructions from your health care provider about wearing your sleep device: ? Anytime you are sleeping, including during daytime naps. ? While taking prescription pain medicines, sleeping medicines, or medicines that make you drowsy.  If you smoke, do not smoke without supervision.  Keep all follow-up visits as told by your health care provider. This is important. Contact a health care provider if:  You keep feeling nauseous or you keep vomiting.  You feel light-headed.  You develop a rash.  You have a fever. Get help right away if:  You have trouble breathing. Summary  For several hours after your procedure, you may feel sleepy and have poor judgment.  Have a responsible adult stay with you for at least 24 hours or until you are awake and alert. This information is not intended to replace advice given to you by your health care provider. Make sure you discuss any questions you have with your health care provider. Document Released: 03/14/2016 Document Revised: 07/08/2017 Document Reviewed: 03/14/2016 Elsevier Interactive Patient Education  2019 [employer].    Office visit with [place] in 3 months.

## 2019-01-18 NOTE — Op Note (Signed)
[facility] Patient Name: Donna Ramirez Procedure Date: 01/18/2019 8:11 AM MRN: [id number] Date of Birth: [birth year] Attending MD: Norvel Richards , MD CSN: [id number] Age: 48 Admit Type: Outpatient Procedure:                Upper GI endoscopy Indications:              Dysphagia Providers:                Norvel Richards, MD, Gerome Sam, RN,                            Tammy Vaught, RN, Randa Spike, Technician Referring MD:              Medicines:                Propofol per Anesthesia Complications:            No immediate complications. Estimated Blood Loss:     Estimated blood loss: none. Procedure:                Pre-Anesthesia Assessment:                           - Prior to the procedure, a History and Physical                            was performed, and patient medications and                            allergies were reviewed. The patient's tolerance of                            previous anesthesia was also reviewed. The risks                            and benefits of the procedure and the sedation                            options and risks were discussed with the patient.                            All questions were answered, and informed consent                            was obtained. Prior Anticoagulants: The patient has                            taken no previous anticoagulant or antiplatelet                            agents. ASA Grade Assessment: II - A patient with                            mild systemic disease. After reviewing the risks  and benefits, the patient was deemed in                            satisfactory condition to undergo the procedure.                           After obtaining informed consent, the endoscope was                            passed under direct vision. Throughout the                            procedure, the patient's blood pressure, pulse, and                            oxygen  saturations were monitored continuously. The                            GIF-H190 ([id number]) scope was introduced through the                            and advanced to the second part of duodenum. The                            upper GI endoscopy was accomplished without                            difficulty. The patient tolerated the procedure                            well. Scope In: 8:30:53 AM Scope Out: 8:37:06 AM Total Procedure Duration: 0 hours 6 minutes 13 seconds  Findings:      The examined esophagus was normal.      A small hiatal hernia was present.      The exam was otherwise without abnormality. The scope was withdrawn.       Dilation was performed with a Maloney dilator with mild resistance at 14       Fr. The dilation site was examined following endoscope reinsertion and       showed no change. Estimated blood loss: none. Impression:               - Normal esophagus. Dilated.                           - Small hiatal hernia.                           - The examination was otherwise normal.                           - No specimens collected. Moderate Sedation:      Moderate (conscious) sedation was personally administered by an       anesthesia professional. The following parameters were monitored: oxygen       saturation, heart rate, blood pressure, respiratory rate, EKG, adequacy  of pulmonary ventilation, and response to care. Recommendation:           - Patient has a contact number available for                            emergencies. The signs and symptoms of potential                            delayed complications were discussed with the                            patient. Return to normal activities tomorrow.                            Written discharge instructions were provided to the                            patient.                           - Resume previous diet.                           - Continue present medications.                           -  No repeat upper endoscopy.                           - Return to GI office in 3 months. Procedure Code(s):        --- Professional ---                           [phone], Esophagogastroduodenoscopy, flexible,                            transoral; diagnostic, including collection of                            specimen(s) by brushing or washing, when performed                            (separate procedure)                           43450, Dilation of esophagus, by unguided sound or                            bougie, single or multiple passes Diagnosis Code(s):        --- Professional ---                           K44.9, Diaphragmatic hernia without obstruction or                            gangrene  R13.10, Dysphagia, unspecified CPT copyright 2018 American Medical Association. All rights reserved. The codes documented in this report are preliminary and upon coder review may  be revised to meet current compliance requirements. Cristopher Estimable. Clydette Privitera, MD Norvel Richards, MD 01/18/2019 8:50:26 AM This report has been signed electronically. Number of Addenda: 0

## 2019-01-18 NOTE — Anesthesia Preprocedure Evaluation (Signed)
Anesthesia Evaluation    Airway Mallampati: II       Dental  (+) Teeth Intact   Pulmonary former smoker,    breath sounds clear to auscultation       Cardiovascular  Rhythm:regular     Neuro/Psych    GI/Hepatic   Endo/Other    Renal/GU      Musculoskeletal   Abdominal   Peds  Hematology   Anesthesia Other Findings Non-hodgkins lymphoma, thrombocytopenia Fatty liver  Reproductive/Obstetrics                             Anesthesia Physical Anesthesia Plan  ASA: III  Anesthesia Plan: MAC   Post-op Pain Management:    Induction:   PONV Risk Score and Plan:   Airway Management Planned:   Additional Equipment:   Intra-op Plan:   Post-operative Plan:   Informed Consent: I have reviewed the patients History and Physical, chart, labs and discussed the procedure including the risks, benefits and alternatives for the proposed anesthesia with the patient or authorized representative who has indicated his/her understanding and acceptance.       Plan Discussed with: Anesthesiologist  Anesthesia Plan Comments:         Anesthesia Quick Evaluation

## 2019-01-18 NOTE — Transfer of Care (Signed)
Immediate Anesthesia Transfer of Care Note  Patient: Donna Ramirez  Procedure(s) Performed: ESOPHAGOGASTRODUODENOSCOPY (EGD) WITH PROPOFOL (N/A ) MALONEY DILATION (N/A )  Patient Location: PACU  Anesthesia Type:MAC  Level of Consciousness: awake, alert  and oriented  Airway & Oxygen Therapy: Patient Spontanous Breathing  Post-op Assessment: Report given to RN  Post vital signs: Reviewed and stable  Last Vitals:  Vitals Value Taken Time  BP 110/69 01/18/2019  8:48 AM  Temp    Pulse 76 01/18/2019  8:51 AM  Resp 18 01/18/2019  8:51 AM  SpO2 93 % 01/18/2019  8:51 AM  Vitals shown include unvalidated device data.  Last Pain:  Vitals:   01/18/19 0726  TempSrc: Oral  PainSc: 0-No pain      Patients Stated Pain Goal: 5 (02/54/27 0623)  Complications: No apparent anesthesia complications

## 2019-01-23 ENCOUNTER — Encounter (HOSPITAL_COMMUNITY): Payer: Self-pay | Admitting: Internal Medicine

## 2019-02-22 ENCOUNTER — Encounter: Payer: Self-pay | Admitting: Internal Medicine

## 2019-06-11 ENCOUNTER — Telehealth: Payer: Self-pay | Admitting: Internal Medicine

## 2019-06-11 ENCOUNTER — Encounter: Payer: Self-pay | Admitting: Internal Medicine

## 2019-06-11 ENCOUNTER — Ambulatory Visit: Payer: Medicare Other | Admitting: Gastroenterology

## 2019-06-11 NOTE — Telephone Encounter (Signed)
PATIENT WAS A NO SHOW AND LETTER SENT  °

## 2019-09-26 ENCOUNTER — Other Ambulatory Visit (HOSPITAL_COMMUNITY): Payer: Self-pay | Admitting: Family Medicine

## 2019-09-26 DIAGNOSIS — Z1231 Encounter for screening mammogram for malignant neoplasm of breast: Secondary | ICD-10-CM

## 2019-10-05 ENCOUNTER — Other Ambulatory Visit: Payer: Self-pay | Admitting: Obstetrics and Gynecology

## 2019-10-06 ENCOUNTER — Other Ambulatory Visit: Payer: Self-pay | Admitting: Obstetrics and Gynecology

## 2019-10-06 DIAGNOSIS — M25561 Pain in right knee: Secondary | ICD-10-CM

## 2019-10-12 ENCOUNTER — Other Ambulatory Visit: Payer: Self-pay

## 2019-10-12 ENCOUNTER — Ambulatory Visit (HOSPITAL_COMMUNITY)
Admission: RE | Admit: 2019-10-12 | Discharge: 2019-10-12 | Disposition: A | Discharge status: Home / Self Care | Payer: Medicare Other | Source: Ambulatory Visit | Attending: Obstetrics and Gynecology | Admitting: Obstetrics and Gynecology

## 2019-10-12 ENCOUNTER — Encounter (HOSPITAL_COMMUNITY): Payer: Self-pay

## 2019-10-12 ENCOUNTER — Other Ambulatory Visit (HOSPITAL_COMMUNITY): Payer: Self-pay | Admitting: Obstetrics and Gynecology

## 2019-10-12 DIAGNOSIS — M25561 Pain in right knee: Secondary | ICD-10-CM

## 2019-10-26 ENCOUNTER — Ambulatory Visit (HOSPITAL_COMMUNITY)
Admission: RE | Admit: 2019-10-26 | Discharge: 2019-10-26 | Disposition: A | Discharge status: Home / Self Care | Payer: Medicare Other | Source: Ambulatory Visit | Attending: Family Medicine | Admitting: Family Medicine

## 2019-10-26 ENCOUNTER — Other Ambulatory Visit: Payer: Self-pay

## 2019-10-26 DIAGNOSIS — Z1231 Encounter for screening mammogram for malignant neoplasm of breast: Secondary | ICD-10-CM | POA: Insufficient documentation

## 2020-11-18 ENCOUNTER — Other Ambulatory Visit (HOSPITAL_COMMUNITY): Payer: Self-pay | Admitting: Family Medicine

## 2020-11-18 DIAGNOSIS — Z1231 Encounter for screening mammogram for malignant neoplasm of breast: Secondary | ICD-10-CM

## 2020-12-04 ENCOUNTER — Encounter: Payer: Self-pay | Admitting: Internal Medicine

## 2020-12-19 ENCOUNTER — Ambulatory Visit (HOSPITAL_COMMUNITY): Payer: Medicare Other

## 2021-01-15 ENCOUNTER — Ambulatory Visit (HOSPITAL_COMMUNITY)
Admission: RE | Admit: 2021-01-15 | Discharge: 2021-01-15 | Disposition: A | Discharge status: Home / Self Care | Payer: Medicare Other | Source: Ambulatory Visit | Attending: Family Medicine | Admitting: Family Medicine

## 2021-01-15 ENCOUNTER — Other Ambulatory Visit: Payer: Self-pay

## 2021-01-15 DIAGNOSIS — Z1231 Encounter for screening mammogram for malignant neoplasm of breast: Secondary | ICD-10-CM | POA: Insufficient documentation

## 2021-02-26 NOTE — Progress Notes (Signed)
Referring Provider: Ardine Eng, MD Primary Care Physician:  Ardine Eng, MD Primary GI Physician: Dr. Gala Romney  Chief Complaint  Patient presents with  . Colonoscopy    Due for recall    HPI:   Donna Ramirez is a 50 y.o. female presenting today to discuss scheduling colonoscopy.  Last colonoscopy in May 2017 with grade 3 hemorrhoids.  Originally recommended repeat colonoscopy in 10 years.  However, due to father with colon polyps less than age 29 and paternal grandmother and paternal great uncle with colon cancer, recommended 5-year surveillance colonoscopy.  Also with history of dysphagia with last EGD in February 2020 revealing normal examined esophagus s/p dilation, small hiatal hernia, otherwise normal exam.  Today:  Everything she eats causes upper abdominal pain, primarily in the epigastric area but also mild pain in the LUQ and RUQ. Aching.  No sharp pain.  She will take Tylenol or lay down to help improve symptoms.  Last 20 minutes.  No identified food triggers.  States it does not matter what she eats.  Also occurs with liquids at times.  Fatty meals do not necessarily cause worse symptoms.  Symptoms started a couple of months ago.  Occasional associated nausea but no vomiting.  She will take OTC nausea medication if needed.  Denies GERD symptoms or dysphagia.  Denies similar symptoms in the past.  Does not have a great appetite right now she does not know what will cause abdominal pain.  Denies NSAIDs or alcohol.  Bowels are moving well. No constipation or diarrhea.  Denies BRBPR or melena.  Thinks she may have hemorrhoids. Rectal discomfort/burning sensation a couple days ago. Preparation H has improved symptoms. Thinks her hemorrhoid was prolapsed. Would like to hold off on colonoscopy. Not sure if she will tolerate the prep.   Marijuana daily to help with sleep. Notices this may also be causing her abdominal pain.   Past Medical History:  Diagnosis Date  . Abdominal  wall pain   . Anxiety   . Chronic back pain   . DDD (degenerative disc disease)   . Depression   . Duodenitis   . Fatty liver   . Fibroid, uterine   . Hearing loss   . HLD (hyperlipidemia)   . Marijuana abuse   . Non Hodgkin's lymphoma ([place]) 2011    Past Surgical History:  Procedure Laterality Date  . ABDOMINAL HYSTERECTOMY  2008   complete  . BREAST EXCISIONAL BIOPSY Bilateral 2012   lymphoma  . COLONOSCOPY  07/31/10   Rourk-hyperplastic polyp  . COLONOSCOPY WITH PROPOFOL N/A 04/26/2016   Dr. Gala Romney: Grade 3 hemorrhoids.  . ESOPHAGOGASTRODUODENOSCOPY  08/19/10   Rourk-noncritical schatzki ring, abnormal and pillow   . ESOPHAGOGASTRODUODENOSCOPY  04/2008   Rourk-mild erosive reflux esophagitis, noncritical Schatzki's ring status post dilation, 1.5 cm pedunculated mass distal D2  . ESOPHAGOGASTRODUODENOSCOPY  10/09   [facility]-normal  . ESOPHAGOGASTRODUODENOSCOPY (EGD) WITH PROPOFOL N/A 04/26/2016   Dr. Gala Romney: Schatzki ring status post dilation, small hiatal hernia.  Marland Kitchen ESOPHAGOGASTRODUODENOSCOPY (EGD) WITH PROPOFOL N/A 01/18/2019   Surgeon: Daneil Dolin, MD;  normal examined esophagus s/p dilation, small hiatal hernia, otherwise normal exam.  . EUS  6/09   Jacobs-6-8 MM nodule seen with recent mucosal biopsy 2-3 cm distal to the major papilla, biopsy benign, 1 cm or adenomatous-appearing mucus at the major papilla biopsy (focal mild inflammation associated with slight atypia)  . LYMPHADENECTOMY  2011  . MALONEY DILATION N/A 04/26/2016   Procedure:  MALONEY DILATION;  Surgeon: Daneil Dolin, MD;  Location: [facility];  Service: Endoscopy;  Laterality: N/A;  Venia Minks DILATION N/A 01/18/2019   Procedure: Venia Minks DILATION;  Surgeon: Daneil Dolin, MD;  Location: [facility];  Service: Endoscopy;  Laterality: N/A;    Current Outpatient Medications  Medication Sig Dispense Refill  . atorvastatin (LIPITOR) 40 MG tablet Take 40 mg by mouth daily.     . DULoxetine (CYMBALTA) 60 MG  capsule Take 120 mg by mouth daily.     No current facility-administered medications for this visit.    Allergies as of 02/27/2021  . (No Known Allergies)    Family History  Problem Relation Age of Onset  . Diabetes Mother   . Breast cancer Mother   . Hypertension Other   . Hyperlipidemia Other   . Cancer Other   . Colon polyps Father        frequent polyps before age 38  . Pancreatic cancer Father   . Colon cancer Paternal Grandmother        also paternal great uncle    Social History   Socioeconomic History  . Marital status: Single    Spouse name: Not on file  . Number of children: 0  . Years of education: Not on file  . Highest education level: Not on file  Occupational History  . Occupation: disabled    Fish farm manager: UNEMPLOYED  Tobacco Use  . Smoking status: Current Every Day Smoker    Packs/day: 1.00    Years: 25.00    Pack years: 25.00    Types: Cigarettes    Last attempt to quit: 10/04/2017    Years since quitting: 3.4  . Smokeless tobacco: Never Used  Substance and Sexual Activity  . Alcohol use: Not Currently    Alcohol/week: 0.0 standard drinks    Comment: rarely couple times per yr  . Drug use: Yes    Frequency: 7.0 times per week    Types: Marijuana  . Sexual activity: Never    Birth control/protection: Surgical  Other Topics Concern  . Not on file  Social History Narrative  . Not on file   Social Determinants of Health   Financial Resource Strain: Not on file  Food Insecurity: Not on file  Transportation Needs: Not on file  Physical Activity: Not on file  Stress: Not on file  Social Connections: Not on file    Review of Systems: Gen: Denies fever, chills, cold or flulike symptoms, presyncope, syncope. CV: Denies chest pain or palpitations. Resp: Denies dyspnea or cough. GI: See HPI Heme: See HPI  Physical Exam: BP 121/77   Pulse 87   Temp 97.6 F (36.4 C)   Ht 5\' 5"  (1.651 m)   Wt 193 lb 12.8 oz (87.9 kg)   BMI 32.25 kg/m   General:   Alert and oriented. No distress noted. Pleasant and cooperative.  Head:  Normocephalic and atraumatic. Eyes:  Conjuctiva clear without scleral icterus. Heart:  S1, S2 present without murmurs appreciated. Lungs:  Clear to auscultation bilaterally. No wheezes, rales, or rhonchi. No distress.  Abdomen:  +BS, soft, and non-distended.  Moderate TTP in epigastric area and mild TTP in RUQ and LUQ.  No rebound or guarding. No HSM or masses noted. Msk:  Symmetrical without gross deformities. Normal posture. Extremities:  Without edema. Neurologic:  Alert and  oriented x4 Psych: Normal mood and affect.

## 2021-02-27 ENCOUNTER — Encounter: Payer: Self-pay | Admitting: *Deleted

## 2021-02-27 ENCOUNTER — Other Ambulatory Visit: Payer: Self-pay

## 2021-02-27 ENCOUNTER — Encounter: Payer: Self-pay | Admitting: Gastroenterology

## 2021-02-27 ENCOUNTER — Ambulatory Visit (INDEPENDENT_AMBULATORY_CARE_PROVIDER_SITE_OTHER): Payer: Medicare Other | Admitting: Gastroenterology

## 2021-02-27 VITALS — BP 121/77 | HR 87 | Temp 97.6°F | Ht 65.0 in | Wt 193.8 lb

## 2021-02-27 DIAGNOSIS — R1010 Upper abdominal pain, unspecified: Secondary | ICD-10-CM | POA: Diagnosis not present

## 2021-02-27 DIAGNOSIS — Z1159 Encounter for screening for other viral diseases: Secondary | ICD-10-CM

## 2021-02-27 DIAGNOSIS — Z800 Family history of malignant neoplasm of digestive organs: Secondary | ICD-10-CM

## 2021-02-27 DIAGNOSIS — Z8371 Family history of colonic polyps: Secondary | ICD-10-CM | POA: Diagnosis not present

## 2021-02-27 DIAGNOSIS — R110 Nausea: Secondary | ICD-10-CM

## 2021-02-27 DIAGNOSIS — K642 Third degree hemorrhoids: Secondary | ICD-10-CM

## 2021-02-27 NOTE — Assessment & Plan Note (Addendum)
50 year old female with family history significant for father with multiple colon polyps before age 21 and paternal grandmother and paternal great uncle with colon cancer. Due to this history, she was recommended to have repeat colonoscopy in 5 years.  Last colonoscopy in 2017 with grade 3 hemorrhoids.  She has no personal history of polyps.  I think it would be reasonable to complete a 5-year surveillance colonoscopy, but she can likely return to average risk screening if she again has no colon polyps. Recommendations will be made by Dr. Gala Romney.   We will hold off on scheduling colonoscopy for now as she has no lower GI alarm symptoms and is dealing with upper abdominal pain as discussed above. We will circle back to this in the near future.

## 2021-02-27 NOTE — Assessment & Plan Note (Signed)
Addressed under abdominal pain.  

## 2021-02-27 NOTE — Assessment & Plan Note (Signed)
Addressed below under family history of colonic polyps.

## 2021-02-27 NOTE — Assessment & Plan Note (Signed)
Patient needs a one-time screen for hepatitis C.   Plan:  Hepatitis C antibody.

## 2021-02-27 NOTE — Assessment & Plan Note (Addendum)
Patient reports a couple month history of postprandial upper abdominal pain, most severe in the epigastric area lasting about 20 minutes with no identified triggers.  States this occurs with all meals and occasional liquids.  Occasional associated nausea without vomiting. Denies BRBPR, melena, or GERD.  She is not on a PPI or taking any other antacid medications. Denies NSAIDs or alcohol.  Admits to marijuana use nightly. Last EGD in February 2020 with normal examined esophagus s/p dilation, small hiatal hernia, otherwise normal exam.  On exam, she has moderate TTP in epigastric area and mild TTP in RUQ and LUQ.  Differentials include gastritis, duodenitis, H. pylori, PUD, biliary etiology, and less likely pancreatitis. Symptoms may also be related to daily marijuana use.   Plan: 1.  CBC, CMP, lipase, H. pylori breath test. 2.  RUQ ultrasound. 3.  Counseled on complete avoidance of marijuana.  4.  Avoid all NSAIDs. 5.  Further recommendations to follow.  Will consider EGD if workup is unrevealing and empirically starting PPI.   6.  Follow-up in 8 weeks.

## 2021-02-27 NOTE — Assessment & Plan Note (Signed)
Grade 3 hemorrhoids noted on colonoscopy in May 2017.  Patient reports a few days ago, she began to have some rectal discomfort/burning sensation which is now improved with Preparation H.  Denies rectal bleeding or constipation.    Suspect mild flare of hemorrhoids. Recommended completing a 7-day course of Preparation H then continue to use as needed for hemorrhoid symptoms.

## 2021-02-27 NOTE — Patient Instructions (Signed)
Please have labs completed at [place].  We will arrange for you to have an ultrasound of your abdomen at [facility].  As we discussed, please work towards cessation of marijuana use as this may be contributing to your nausea and abdominal pain.  In general, avoid fried, fatty, greasy, spicy, citrus foods. Carbonated beverages and caffeine.  Avoid NSAID products including ibuprofen, Aleve, Advil, Goody powders, BC powders, naproxen, and anything that says "NSAID" on the package.  We will be in contact with you with your lab and imaging results with further recommendations at that time.  We will plan to see back in 8 weeks.  Do not hesitate to call if you have questions or concerns prior.   It was a pleasure to meet you today!  Aliene Altes, PA-C [facility]

## 2021-03-04 ENCOUNTER — Other Ambulatory Visit (HOSPITAL_COMMUNITY)
Admission: RE | Admit: 2021-03-04 | Discharge: 2021-03-04 | Disposition: A | Discharge status: Home / Self Care | Payer: Medicare Other | Source: Ambulatory Visit | Attending: Gastroenterology | Admitting: Gastroenterology

## 2021-03-04 ENCOUNTER — Ambulatory Visit (HOSPITAL_COMMUNITY)
Admission: RE | Admit: 2021-03-04 | Discharge: 2021-03-04 | Disposition: A | Discharge status: Home / Self Care | Payer: Medicare Other | Source: Ambulatory Visit | Attending: Gastroenterology | Admitting: Gastroenterology

## 2021-03-04 ENCOUNTER — Other Ambulatory Visit: Payer: Self-pay

## 2021-03-04 DIAGNOSIS — R1010 Upper abdominal pain, unspecified: Secondary | ICD-10-CM | POA: Insufficient documentation

## 2021-03-04 DIAGNOSIS — R110 Nausea: Secondary | ICD-10-CM | POA: Diagnosis present

## 2021-03-04 DIAGNOSIS — Z1159 Encounter for screening for other viral diseases: Secondary | ICD-10-CM | POA: Insufficient documentation

## 2021-03-04 LAB — COMPLETE METABOLIC PANEL WITH GFR: Potassium: 4.2 mmol/L (ref 3.5–5.3)

## 2021-03-04 LAB — LIPASE: Lipase: 12 U/L (ref 7–60)

## 2021-03-04 LAB — CBC WITH DIFFERENTIAL/PLATELET: RBC: 4.76 10*6/uL (ref 3.80–5.10)

## 2021-03-05 ENCOUNTER — Other Ambulatory Visit: Payer: Self-pay

## 2021-03-05 DIAGNOSIS — K838 Other specified diseases of biliary tract: Secondary | ICD-10-CM

## 2021-03-05 DIAGNOSIS — R110 Nausea: Secondary | ICD-10-CM

## 2021-03-05 DIAGNOSIS — R1010 Upper abdominal pain, unspecified: Secondary | ICD-10-CM

## 2021-03-05 LAB — COMPLETE METABOLIC PANEL WITH GFR
AG Ratio: 1.9 (calc) (ref 1.0–2.5)
ALT: 11 U/L (ref 6–29)
AST: 13 U/L (ref 10–35)
Albumin: 4.4 g/dL (ref 3.6–5.1)
Alkaline phosphatase (APISO): 109 U/L (ref 37–153)
BUN: 13 mg/dL (ref 7–25)
CO2: 27 mmol/L (ref 20–32)
Calcium: 9.6 mg/dL (ref 8.6–10.4)
Chloride: 106 mmol/L (ref 98–110)
Creat: 0.76 mg/dL (ref 0.50–1.05)
GFR, Est African American: 106 mL/min/{1.73_m2} (ref >=60)
GFR, Est Non African American: 91 mL/min/{1.73_m2} (ref >=60)
Globulin: 2.3 g/dL (calc) (ref 1.9–3.7)
Glucose, Bld: 94 mg/dL (ref 65–99)
Sodium: 140 mmol/L (ref 135–146)
Total Bilirubin: 0.5 mg/dL (ref 0.2–1.2)
Total Protein: 6.7 g/dL (ref 6.1–8.1)

## 2021-03-05 LAB — HEPATITIS C ANTIBODY
Hepatitis C Ab: NONREACTIVE
SIGNAL TO CUT-OFF: 0 (ref <1.00)

## 2021-03-05 LAB — CBC WITH DIFFERENTIAL/PLATELET
Absolute Monocytes: 431 cells/uL (ref 200–950)
Basophils Absolute: 29 cells/uL (ref 0–200)
Basophils Relative: 0.4 %
Eosinophils Absolute: 190 cells/uL (ref 15–500)
Eosinophils Relative: 2.6 %
HCT: 42.3 % (ref 35.0–45.0)
Hemoglobin: 13.8 g/dL (ref 11.7–15.5)
Lymphs Abs: 3000 cells/uL (ref 850–3900)
MCH: 29 pg (ref 27.0–33.0)
MCHC: 32.6 g/dL (ref 32.0–36.0)
MCV: 88.9 fL (ref 80.0–100.0)
MPV: 10.2 fL (ref 7.5–12.5)
Monocytes Relative: 5.9 %
Neutro Abs: 3650 cells/uL (ref 1500–7800)
Neutrophils Relative %: 50 %
Platelets: 255 10*3/uL (ref 140–400)
RDW: 12.9 % (ref 11.0–15.0)
Total Lymphocyte: 41.1 %
WBC: 7.3 10*3/uL (ref 3.8–10.8)

## 2021-03-05 LAB — H. PYLORI BREATH TEST: H. pylori Breath Test: NOT DETECTED

## 2021-03-08 ENCOUNTER — Other Ambulatory Visit: Payer: Self-pay | Admitting: Gastroenterology

## 2021-03-08 DIAGNOSIS — R1010 Upper abdominal pain, unspecified: Secondary | ICD-10-CM

## 2021-03-08 DIAGNOSIS — R110 Nausea: Secondary | ICD-10-CM

## 2021-03-08 MED ORDER — PANTOPRAZOLE SODIUM 40 MG PO TBEC: 40.0000 mg | DELAYED_RELEASE_TABLET | Freq: Every day | ORAL | 3 refills | Status: DC

## 2021-03-10 ENCOUNTER — Other Ambulatory Visit: Payer: Self-pay | Admitting: Gastroenterology

## 2021-03-10 ENCOUNTER — Ambulatory Visit (HOSPITAL_COMMUNITY)
Admission: RE | Admit: 2021-03-10 | Discharge: 2021-03-10 | Disposition: A | Discharge status: Home / Self Care | Payer: Medicare Other | Source: Ambulatory Visit | Attending: Gastroenterology | Admitting: Gastroenterology

## 2021-03-10 DIAGNOSIS — K838 Other specified diseases of biliary tract: Secondary | ICD-10-CM | POA: Diagnosis present

## 2021-03-10 DIAGNOSIS — R110 Nausea: Secondary | ICD-10-CM | POA: Insufficient documentation

## 2021-03-10 DIAGNOSIS — R1010 Upper abdominal pain, unspecified: Secondary | ICD-10-CM | POA: Diagnosis present

## 2021-03-10 MED ORDER — GADOBUTROL 1 MMOL/ML IV SOLN
10.0000 mL | Freq: Once | INTRAVENOUS | Status: AC | PRN
  Administered 2021-03-10: 10 mL via INTRAVENOUS

## 2021-03-19 ENCOUNTER — Telehealth: Payer: Self-pay | Admitting: Internal Medicine

## 2021-03-19 NOTE — Telephone Encounter (Signed)
Pt called to say that she was seen by Aliene Altes, [place] on 02/27/2021 and wanted to know when she could be scheduled for her EGD. Please adivse. [phone]

## 2021-03-19 NOTE — Telephone Encounter (Signed)
Called pt, EGD w/Propofol w/Dr. Gala Romney ASA 3 scheduled for 05/11/21 at 10:30am. Orders entered.   PA for EGD submitted via [facility] website. PA# [id number], valid 05/11/21-08/09/21.

## 2021-03-24 NOTE — Telephone Encounter (Signed)
Pre-op/covid test 05/07/21. Appt letter mailed with procedure instructions.

## 2021-04-27 NOTE — Progress Notes (Deleted)
Referring Provider: Ardine Eng, MD Primary Care Physician:  Ardine Eng, MD Primary GI Physician: Dr. Gala Romney  No chief complaint on file.   HPI:   Donna Ramirez is a 50 y.o. female presenting today for follow-up of upper abdominal pain, nausea with vomiting.  She is also due for colonoscopy.  Last colonoscopy in May 2017 with grade 3 hemorrhoids.  Originally recommended repeat colonoscopy in 10 years.  However, due to father with colon polyps less than age 32 and paternal grandmother and paternal great uncle with colon cancer, recommended 5-year surveillance colonoscopy.  Last seen in our office 02/27/2021 with chief complaint of upper abdominal pain, nausea without vomiting.  She reported postprandial upper abdominal pain for the last couple of months most severe in the epigastric area lasting about 20 minutes with no identified triggers.  Occurring with meals and occasionally liquids.  Occasional associated nausea without vomiting.  Denies BRBPR, melena, or GERD symptoms.  She was not on a PPI.  Denies NSAIDs or alcohol.  She uses marijuana nightly.  Plan to update labs, obtain RUQ ultrasound, avoid marijuana, further recommendations to follow. We also discussed need for colonoscopy, but plan to hold off on this due to upper GI symptoms.  We also discussed guidelines for hep C screening.  Labs completed 03/04/2021: CBC, CMP, lipase within normal limits.  H. pylori breath test negative.  Hep C antibody negative.  She was started on Protonix 40 mg daily.  RUQ ultrasound 03/04/2021: 3 mm gallbladder polyp, dilated CBD at 8.6 mm, hepatic steatosis. MRI/MRCP 03/10/2021: No biliary dilation.  CBD upper limit of normal at 6 mm.  No acute findings.  Recommended proceeding with EGD  EGD scheduled for 6/6.   Today:    Past Medical History:  Diagnosis Date  . Abdominal wall pain   . Anxiety   . Chronic back pain   . DDD (degenerative disc disease)   . Depression   . Duodenitis   . Fatty  liver   . Fibroid, uterine   . Hearing loss   . HLD (hyperlipidemia)   . Marijuana abuse   . Non Hodgkin's lymphoma ([place]) 2011    Past Surgical History:  Procedure Laterality Date  . ABDOMINAL HYSTERECTOMY  2008   complete  . BREAST EXCISIONAL BIOPSY Bilateral 2012   lymphoma  . COLONOSCOPY  07/31/10   Rourk-hyperplastic polyp  . COLONOSCOPY WITH PROPOFOL N/A 04/26/2016   Dr. Gala Romney: Grade 3 hemorrhoids.  . ESOPHAGOGASTRODUODENOSCOPY  08/19/10   Rourk-noncritical schatzki ring, abnormal and pillow   . ESOPHAGOGASTRODUODENOSCOPY  04/2008   Rourk-mild erosive reflux esophagitis, noncritical Schatzki's ring status post dilation, 1.5 cm pedunculated mass distal D2  . ESOPHAGOGASTRODUODENOSCOPY  10/09   [facility]-normal  . ESOPHAGOGASTRODUODENOSCOPY (EGD) WITH PROPOFOL N/A 04/26/2016   Dr. Gala Romney: Schatzki ring status post dilation, small hiatal hernia.  Marland Kitchen ESOPHAGOGASTRODUODENOSCOPY (EGD) WITH PROPOFOL N/A 01/18/2019   Surgeon: Daneil Dolin, MD;  normal examined esophagus s/p dilation, small hiatal hernia, otherwise normal exam.  . EUS  6/09   Jacobs-6-8 MM nodule seen with recent mucosal biopsy 2-3 cm distal to the major papilla, biopsy benign, 1 cm or adenomatous-appearing mucus at the major papilla biopsy (focal mild inflammation associated with slight atypia)  . LYMPHADENECTOMY  2011  . MALONEY DILATION N/A 04/26/2016   Procedure: Venia Minks DILATION;  Surgeon: Daneil Dolin, MD;  [facility];  Service: Endoscopy;  Laterality: N/A;  . MALONEY DILATION N/A 01/18/2019   Procedure:  MALONEY DILATION;  Surgeon: Daneil Dolin, MD;  [facility];  Service: Endoscopy;  Laterality: N/A;    Current Outpatient Medications  Medication Sig Dispense Refill  . atorvastatin (LIPITOR) 40 MG tablet Take 40 mg by mouth daily.     . DULoxetine (CYMBALTA) 60 MG capsule Take 120 mg by mouth daily.    . pantoprazole (PROTONIX) 40 MG tablet Take 1 tablet (40 mg total) by mouth daily before  breakfast. 30 tablet 3   No current facility-administered medications for this visit.    Allergies as of 04/29/2021  . (No Known Allergies)    Family History  Problem Relation Age of Onset  . Diabetes Mother   . Breast cancer Mother   . Hypertension Other   . Hyperlipidemia Other   . Cancer Other   . Colon polyps Father        frequent polyps before age 51  . Pancreatic cancer Father   . Colon cancer Paternal Grandmother        also paternal great uncle    Social History   Socioeconomic History  . Marital status: Single    Spouse name: Not on file  . Number of children: 0  . Years of education: Not on file  . Highest education level: Not on file  Occupational History  . Occupation: disabled    Fish farm manager: UNEMPLOYED  Tobacco Use  . Smoking status: Current Every Day Smoker    Packs/day: 1.00    Years: 25.00    Pack years: 25.00    Types: Cigarettes    Last attempt to quit: 10/04/2017    Years since quitting: 3.5  . Smokeless tobacco: Never Used  Substance and Sexual Activity  . Alcohol use: Not Currently    Alcohol/week: 0.0 standard drinks    Comment: rarely couple times per yr  . Drug use: Yes    Frequency: 7.0 times per week    Types: Marijuana  . Sexual activity: Never    Birth control/protection: Surgical  Other Topics Concern  . Not on file  Social History Narrative  . Not on file   Social Determinants of Health   Financial Resource Strain: Not on file  Food Insecurity: Not on file  Transportation Needs: Not on file  Physical Activity: Not on file  Stress: Not on file  Social Connections: Not on file    Review of Systems: Gen: Denies fever, chills, anorexia. Denies fatigue, weakness, weight loss.  CV: Denies chest pain, palpitations, syncope, peripheral edema, and claudication. Resp: Denies dyspnea at rest, cough, wheezing, coughing up blood, and pleurisy. GI: Denies vomiting blood, jaundice, and fecal incontinence.   Denies dysphagia or  odynophagia. Derm: Denies rash, itching, dry skin Psych: Denies depression, anxiety, memory loss, confusion. No homicidal or suicidal ideation.  Heme: Denies bruising, bleeding, and enlarged lymph nodes.  Physical Exam: There were no vitals taken for this visit. General:   Alert and oriented. No distress noted. Pleasant and cooperative.  Head:  Normocephalic and atraumatic. Eyes:  Conjuctiva clear without scleral icterus. Mouth:  Oral mucosa pink and moist. Good dentition. No lesions. Heart:  S1, S2 present without murmurs appreciated. Lungs:  Clear to auscultation bilaterally. No wheezes, rales, or rhonchi. No distress.  Abdomen:  +BS, soft, non-tender and non-distended. No rebound or guarding. No HSM or masses noted. Msk:  Symmetrical without gross deformities. Normal posture. Extremities:  Without edema. Neurologic:  Alert and  oriented x4 Psych:  Alert and cooperative. Normal mood and  affect.

## 2021-04-29 ENCOUNTER — Ambulatory Visit: Payer: Medicare Other | Admitting: Gastroenterology

## 2021-05-06 NOTE — Patient Instructions (Signed)
Donna Ramirez  05/06/2021     @PREFPERIOPPHARMACY @   Your procedure is scheduled on  05/11/2021.   Report to [facility] at  0830  A.M.   Call this number if you have problems the morning of surgery:  [phone]   Remember:  Follow the diet instructions given to you by the office.                       Take these medicines the morning of surgery with A SIP OF WATER  Cymbalta, protonix.     Please brush your teeth.  Do not wear jewelry, make-up or nail polish.  Do not wear lotions, powders, or perfumes, or deodorant.  Do not shave 48 hours prior to surgery.  Men may shave face and neck.  Do not bring valuables to the hospital.  [facility] is not responsible for any belongings or valuables.  Contacts, dentures or bridgework may not be worn into surgery.  Leave your suitcase in the car.  After surgery it may be brought to your room.  For patients admitted to the hospital, discharge time will be determined by your treatment team.  Patients discharged the day of surgery will not be allowed to drive home and must have someone with them for 24 hours.    Special instructions:  DO NOT smoke tobacco or vape for 24 hours before your procedure.  Please read over the following fact sheets that you were given. Anesthesia Post-op Instructions and Care and Recovery After Surgery       Upper Endoscopy, Adult, Care After This sheet gives you information about how to care for yourself after your procedure. Your health care provider may also give you more specific instructions. If you have problems or questions, contact your health care provider. What can I expect after the procedure? After the procedure, it is common to have:  A sore throat.  Mild stomach pain or discomfort.  Bloating.  Nausea. Follow these instructions at home:  Follow instructions from your health care provider about what to eat or drink after your procedure.  Return to your normal activities as  told by your health care provider. Ask your health care provider what activities are safe for you.  Take over-the-counter and prescription medicines only as told by your health care provider.  If you were given a sedative during the procedure, it can affect you for several hours. Do not drive or operate machinery until your health care provider says that it is safe.  Keep all follow-up visits as told by your health care provider. This is important.   Contact a health care provider if you have:  A sore throat that lasts longer than one day.  Trouble swallowing. Get help right away if:  You vomit blood or your vomit looks like coffee grounds.  You have: ? A fever. ? Bloody, black, or tarry stools. ? A severe sore throat or you cannot swallow. ? Difficulty breathing. ? Severe pain in your chest or abdomen. Summary  After the procedure, it is common to have a sore throat, mild stomach discomfort, bloating, and nausea.  If you were given a sedative during the procedure, it can affect you for several hours. Do not drive or operate machinery until your health care provider says that it is safe.  Follow instructions from your health care provider about what to eat or drink after your procedure.  Return to your normal  activities as told by your health care provider. This information is not intended to replace advice given to you by your health care provider. Make sure you discuss any questions you have with your health care provider. Document Revised: 11/20/2019 Document Reviewed: 04/24/2018 Elsevier Patient Education  2021 [place] After This sheet gives you information about how to care for yourself after your procedure. Your health care provider may also give you more specific instructions. If you have problems or questions, contact your health care provider. What can I expect after the procedure? After the procedure, it is common to  have:  Tiredness.  Forgetfulness about what happened after the procedure.  Impaired judgment for important decisions.  Nausea or vomiting.  Some difficulty with balance. Follow these instructions at home: For the time period you were told by your health care provider:  Rest as needed.  Do not participate in activities where you could fall or become injured.  Do not drive or use machinery.  Do not drink alcohol.  Do not take sleeping pills or medicines that cause drowsiness.  Do not make important decisions or sign legal documents.  Do not take care of children on your own.      Eating and drinking  Follow the diet that is recommended by your health care provider.  Drink enough fluid to keep your urine pale yellow.  If you vomit: ? Drink water, juice, or soup when you can drink without vomiting. ? Make sure you have little or no nausea before eating solid foods. General instructions  Have a responsible adult stay with you for the time you are told. It is important to have someone help care for you until you are awake and alert.  Take over-the-counter and prescription medicines only as told by your health care provider.  If you have sleep apnea, surgery and certain medicines can increase your risk for breathing problems. Follow instructions from your health care provider about wearing your sleep device: ? Anytime you are sleeping, including during daytime naps. ? While taking prescription pain medicines, sleeping medicines, or medicines that make you drowsy.  Avoid smoking.  Keep all follow-up visits as told by your health care provider. This is important. Contact a health care provider if:  You keep feeling nauseous or you keep vomiting.  You feel light-headed.  You are still sleepy or having trouble with balance after 24 hours.  You develop a rash.  You have a fever.  You have redness or swelling around the IV site. Get help right away if:  You have  trouble breathing.  You have new-onset confusion at home. Summary  For several hours after your procedure, you may feel tired. You may also be forgetful and have poor judgment.  Have a responsible adult stay with you for the time you are told. It is important to have someone help care for you until you are awake and alert.  Rest as told. Do not drive or operate machinery. Do not drink alcohol or take sleeping pills.  Get help right away if you have trouble breathing, or if you suddenly become confused. This information is not intended to replace advice given to you by your health care provider. Make sure you discuss any questions you have with your health care provider. Document Revised: 08/07/2020 Document Reviewed: 10/25/2019 Elsevier Patient Education  2021 [employer].

## 2021-05-07 ENCOUNTER — Encounter (HOSPITAL_COMMUNITY): Payer: Self-pay

## 2021-05-07 ENCOUNTER — Encounter (HOSPITAL_COMMUNITY)
Admission: RE | Admit: 2021-05-07 | Discharge: 2021-05-07 | Disposition: A | Discharge status: Home / Self Care | Payer: Medicare Other | Source: Ambulatory Visit | Attending: Internal Medicine | Admitting: Internal Medicine

## 2021-05-07 ENCOUNTER — Other Ambulatory Visit: Payer: Self-pay

## 2021-05-07 ENCOUNTER — Other Ambulatory Visit (HOSPITAL_COMMUNITY): Payer: Medicare Other

## 2021-05-11 ENCOUNTER — Ambulatory Visit (HOSPITAL_COMMUNITY)
Admission: RE | Admit: 2021-05-11 | Discharge: 2021-05-11 | Disposition: A | Discharge status: Home / Self Care | Payer: Medicare Other | Attending: Internal Medicine | Admitting: Internal Medicine

## 2021-05-11 ENCOUNTER — Ambulatory Visit (HOSPITAL_COMMUNITY): Payer: Medicare Other | Admitting: Anesthesiology

## 2021-05-11 ENCOUNTER — Encounter (HOSPITAL_COMMUNITY)
Admission: RE | Disposition: A | Discharge status: Home / Self Care | Payer: Self-pay | Source: Home / Self Care | Attending: Internal Medicine

## 2021-05-11 DIAGNOSIS — Z8249 Family history of ischemic heart disease and other diseases of the circulatory system: Secondary | ICD-10-CM | POA: Insufficient documentation

## 2021-05-11 DIAGNOSIS — Z8572 Personal history of non-Hodgkin lymphomas: Secondary | ICD-10-CM | POA: Insufficient documentation

## 2021-05-11 DIAGNOSIS — Z803 Family history of malignant neoplasm of breast: Secondary | ICD-10-CM | POA: Insufficient documentation

## 2021-05-11 DIAGNOSIS — R1013 Epigastric pain: Secondary | ICD-10-CM | POA: Diagnosis not present

## 2021-05-11 DIAGNOSIS — K449 Diaphragmatic hernia without obstruction or gangrene: Secondary | ICD-10-CM | POA: Insufficient documentation

## 2021-05-11 DIAGNOSIS — Z8349 Family history of other endocrine, nutritional and metabolic diseases: Secondary | ICD-10-CM | POA: Insufficient documentation

## 2021-05-11 DIAGNOSIS — Z79899 Other long term (current) drug therapy: Secondary | ICD-10-CM | POA: Insufficient documentation

## 2021-05-11 DIAGNOSIS — Z809 Family history of malignant neoplasm, unspecified: Secondary | ICD-10-CM | POA: Insufficient documentation

## 2021-05-11 DIAGNOSIS — E785 Hyperlipidemia, unspecified: Secondary | ICD-10-CM | POA: Diagnosis not present

## 2021-05-11 DIAGNOSIS — R110 Nausea: Secondary | ICD-10-CM | POA: Diagnosis not present

## 2021-05-11 DIAGNOSIS — Z800 Family history of malignant neoplasm of digestive organs: Secondary | ICD-10-CM | POA: Insufficient documentation

## 2021-05-11 DIAGNOSIS — Z833 Family history of diabetes mellitus: Secondary | ICD-10-CM | POA: Diagnosis not present

## 2021-05-11 DIAGNOSIS — F17210 Nicotine dependence, cigarettes, uncomplicated: Secondary | ICD-10-CM | POA: Diagnosis not present

## 2021-05-11 HISTORY — PX: ESOPHAGOGASTRODUODENOSCOPY (EGD) WITH PROPOFOL: SHX5813

## 2021-05-11 LAB — GLUCOSE, CAPILLARY: Glucose-Capillary: 98 mg/dL (ref 70–99)

## 2021-05-11 SURGERY — ESOPHAGOGASTRODUODENOSCOPY (EGD) WITH PROPOFOL
Anesthesia: General

## 2021-05-11 MED ORDER — PROPOFOL 10 MG/ML IV BOLUS
INTRAVENOUS | Status: DC | PRN
  Administered 2021-05-11 (×3): 50 mg via INTRAVENOUS

## 2021-05-11 MED ORDER — LIDOCAINE HCL (CARDIAC) PF 100 MG/5ML IV SOSY
PREFILLED_SYRINGE | INTRAVENOUS | Status: DC | PRN
  Administered 2021-05-11: 50 mg via INTRAVENOUS

## 2021-05-11 MED ORDER — LACTATED RINGERS IV SOLN: INTRAVENOUS | Status: DC

## 2021-05-11 NOTE — Op Note (Signed)
[facility] Patient Name: Donna Ramirez Procedure Date: 05/11/2021 10:42 AM MRN: [id number] Date of Birth: 1971/05/19 Attending MD: Norvel Richards , MD CSN: [id number] Age: 50 Admit Type: Outpatient Procedure:                Upper GI endoscopy Indications:              Epigastric abdominal pain, Nausea Providers:                Norvel Richards, MD, Crystal Page, Raphael Gibney, Technician Referring MD:              Medicines:                Propofol per Anesthesia Complications:            No immediate complications. Estimated Blood Loss:     Estimated blood loss: none. Estimated blood loss:                            none. Procedure:                Pre-Anesthesia Assessment:                           - Prior to the procedure, a History and Physical                            was performed, and patient medications and                            allergies were reviewed. The patient's tolerance of                            previous anesthesia was also reviewed. The risks                            and benefits of the procedure and the sedation                            options and risks were discussed with the patient.                            All questions were answered, and informed consent                            was obtained. ASA Grade Assessment: III - A patient                            with severe systemic disease. After reviewing the                            risks and benefits, the patient was deemed in  satisfactory condition to undergo the procedure.                           After obtaining informed consent, the endoscope was                            passed under direct vision. Throughout the                            procedure, the patient's blood pressure, pulse, and                            oxygen saturations were monitored continuously. The                            GIF-H190 ([id number]) scope  was introduced through the                            mouth, and advanced to the second part of duodenum. Scope In: 11:00:55 AM Scope Out: 11:04:41 AM Total Procedure Duration: 0 hours 3 minutes 46 seconds  Findings:      The examined esophagus was normal. Patulous EG junction. Small hiatal       hernia.      Gastric mucosa appeared normal.      The exam was otherwise without abnormality.      The duodenal bulb and second portion of the duodenum were normal. Impression:               - Normal esophagus (patulous EG junction).                           - Small hiatal hernia.                           - The examination was otherwise normal.                           - No specimens collected. Patient may have poorly                            controlled reflux to account for the majority of                            her symptoms. Alternatively, biliary dyskinesia has                            not been ruled out. Moderate Sedation:      Moderate (conscious) sedation was personally administered by an       anesthesia professional. The following parameters were monitored: oxygen       saturation, heart rate, blood pressure, respiratory rate, EKG, adequacy       of pulmonary ventilation, and response to care. Recommendation:           - Patient has a contact number available for  emergencies. The signs and symptoms of potential                            delayed complications were discussed with the                            patient. Return to normal activities tomorrow.                            Written discharge instructions were provided to the                            patient.                           - Advance diet as tolerated. Stop Protonix; trial                            of Dexilant 60 mg daily. Office visit with [place] in 6                            weeks. If no improvement in symptoms, would                            consider a HIDA with CCK  challenge as a next step                            in the evaluation. Procedure Code(s):        --- Professional ---                           [phone], Esophagogastroduodenoscopy, flexible,                            transoral; diagnostic, including collection of                            specimen(s) by brushing or washing, when performed                            (separate procedure) Diagnosis Code(s):        --- Professional ---                           K44.9, Diaphragmatic hernia without obstruction or                            gangrene                           R10.13, Epigastric pain                           R11.0, Nausea CPT copyright 2019 American Medical Association. All rights reserved. The codes documented in this report are preliminary and upon coder review may  be revised to  meet current compliance requirements. Cristopher Estimable. Arthor Gorter, MD Norvel Richards, MD 05/11/2021 11:17:43 AM This report has been signed electronically. Number of Addenda: 0

## 2021-05-11 NOTE — H&P (Signed)
@LOGO @   Primary Care Physician:  Ardine Eng, MD Primary Gastroenterologist:  Dr. Gala Romney Pre-Procedure History & Physical: HPI:  Donna Ramirez is a 50 y.o. female here for further evaluation of upper abdominal discomfort postprandial nausea.  Gallbladder polyp on ultrasound negative MRCP.  No dysphagia symptoms currently.  EGD here to further evaluate. No improvement with PPI.  Past Medical History:  Diagnosis Date  . Abdominal wall pain   . Anxiety   . Chronic back pain   . DDD (degenerative disc disease)   . Depression   . Duodenitis   . Fatty liver   . Fibroid, uterine   . Hearing loss   . HLD (hyperlipidemia)   . Marijuana abuse   . Non Hodgkin's lymphoma ([place]) 2011    Past Surgical History:  Procedure Laterality Date  . ABDOMINAL HYSTERECTOMY  2008   complete  . BREAST EXCISIONAL BIOPSY Bilateral 2012   lymphoma  . COLONOSCOPY  07/31/10   Connelly Spruell-hyperplastic polyp  . COLONOSCOPY WITH PROPOFOL N/A 04/26/2016   Dr. Gala Romney: Grade 3 hemorrhoids.  . ESOPHAGOGASTRODUODENOSCOPY  08/19/10   Dusty Raczkowski-noncritical schatzki ring, abnormal and pillow   . ESOPHAGOGASTRODUODENOSCOPY  04/2008   Mable Lashley-mild erosive reflux esophagitis, noncritical Schatzki's ring status post dilation, 1.5 cm pedunculated mass distal D2  . ESOPHAGOGASTRODUODENOSCOPY  10/09   [facility]-normal  . ESOPHAGOGASTRODUODENOSCOPY (EGD) WITH PROPOFOL N/A 04/26/2016   Dr. Gala Romney: Schatzki ring status post dilation, small hiatal hernia.  Marland Kitchen ESOPHAGOGASTRODUODENOSCOPY (EGD) WITH PROPOFOL N/A 01/18/2019   Surgeon: Daneil Dolin, MD;  normal examined esophagus s/p dilation, small hiatal hernia, otherwise normal exam.  . EUS  6/09   Jacobs-6-8 MM nodule seen with recent mucosal biopsy 2-3 cm distal to the major papilla, biopsy benign, 1 cm or adenomatous-appearing mucus at the major papilla biopsy (focal mild inflammation associated with slight atypia)  . LYMPHADENECTOMY  2011  . MALONEY DILATION N/A 04/26/2016   Procedure:  Venia Minks DILATION;  Surgeon: Daneil Dolin, MD;  [facility];  Service: Endoscopy;  Laterality: N/A;  Venia Minks DILATION N/A 01/18/2019   Procedure: Venia Minks DILATION;  Surgeon: Daneil Dolin, MD;  [facility];  Service: Endoscopy;  Laterality: N/A;    Prior to Admission medications   Medication Sig Start Date End Date Taking? Authorizing Provider  atorvastatin (LIPITOR) 40 MG tablet Take 40 mg by mouth at bedtime. 09/23/18  Yes [provider]  DULoxetine (CYMBALTA) 60 MG capsule Take 60 mg by mouth daily. 02/24/21  Yes [provider]  pantoprazole (PROTONIX) 40 MG tablet Take 1 tablet (40 mg total) by mouth daily before breakfast. 03/08/21  Yes Erenest Rasher, PA-C    Allergies as of 03/19/2021  . (No Known Allergies)    Family History  Problem Relation Age of Onset  . Diabetes Mother   . Breast cancer Mother   . Hypertension Other   . Hyperlipidemia Other   . Cancer Other   . Colon polyps Father        frequent polyps before age 49  . Pancreatic cancer Father   . Colon cancer Paternal Grandmother        also paternal great uncle    Social History   Socioeconomic History  . Marital status: Single    Spouse name: Not on file  . Number of children: 0  . Years of education: Not on file  . Highest education level: Not on file  Occupational History  . Occupation: disabled  Employer: UNEMPLOYED  Tobacco Use  . Smoking status: Current Every Day Smoker    Packs/day: 1.00    Years: 25.00    Pack years: 25.00    Types: Cigarettes    Last attempt to quit: 10/04/2017    Years since quitting: 3.6  . Smokeless tobacco: Never Used  Vaping Use  . Vaping Use: Never used  Substance and Sexual Activity  . Alcohol use: Not Currently    Alcohol/week: 0.0 standard drinks    Comment: rarely couple times per yr  . Drug use: Yes    Frequency: 7.0 times per week    Types: Marijuana  . Sexual activity: Never    Birth control/protection:  Surgical  Other Topics Concern  . Not on file  Social History Narrative  . Not on file   Social Determinants of Health   Financial Resource Strain: Not on file  Food Insecurity: Not on file  Transportation Needs: Not on file  Physical Activity: Not on file  Stress: Not on file  Social Connections: Not on file  Intimate Partner Violence: Not on file    Review of Systems: See HPI, otherwise negative ROS  Physical Exam: BP 115/76   Pulse 61   Temp 98.2 F (36.8 C) (Oral)   Resp 18   SpO2 94%  General:   Alert,  Well-developed, well-nourished, pleasant and cooperative in NAD Neck:  Supple; no masses or thyromegaly. No significant cervical adenopathy. Lungs:  Clear throughout to auscultation.   No wheezes, crackles, or rhonchi. No acute distress. Heart:  Regular rate and rhythm; no murmurs, clicks, rubs,  or gallops. Abdomen: Non-distended, normal bowel sounds.  Soft and nontender without appreciable mass or hepatosplenomegaly.  Pulses:  Normal pulses noted. Extremities:  Without clubbing or edema.  Impression/Plan: 50 year old lady with upper abdominal pain postprandial nausea.  No dysphagia.  I have offered the patient a diagnostic EGD per plan. The risks, benefits, limitations, alternatives and imponderables have been reviewed with the patient. Potential for esophageal dilation, biopsy, etc. have also been reviewed.  Questions have been answered. All parties agreeable.     Notice: This dictation was prepared with Dragon dictation along with smaller phrase technology. Any transcriptional errors that result from this process are unintentional and may not be corrected upon review.

## 2021-05-11 NOTE — Discharge Instructions (Signed)
Monitored Anesthesia Care, Care After This sheet gives you information about how to care for yourself after your procedure. Your health care provider may also give you more specific instructions. If you have problems or questions, contact your health care provider. What can I expect after the procedure? After the procedure, it is common to have:  Tiredness.  Forgetfulness about what happened after the procedure.  Impaired judgment for important decisions.  Nausea or vomiting.  Some difficulty with balance. Follow these instructions at home: For the time period you were told by your health care provider:  Rest as needed.  Do not participate in activities where you could fall or become injured.  Do not drive or use machinery.  Do not drink alcohol.  Do not take sleeping pills or medicines that cause drowsiness.  Do not make important decisions or sign legal documents.  Do not take care of children on your own.      Eating and drinking  Follow the diet that is recommended by your health care provider.  Drink enough fluid to keep your urine pale yellow.  If you vomit: ? Drink water, juice, or soup when you can drink without vomiting. ? Make sure you have little or no nausea before eating solid foods. General instructions  Have a responsible adult stay with you for the time you are told. It is important to have someone help care for you until you are awake and alert.  Take over-the-counter and prescription medicines only as told by your health care provider.  If you have sleep apnea, surgery and certain medicines can increase your risk for breathing problems. Follow instructions from your health care provider about wearing your sleep device: ? Anytime you are sleeping, including during daytime naps. ? While taking prescription pain medicines, sleeping medicines, or medicines that make you drowsy.  Avoid smoking.  Keep all follow-up visits as told by your health care  provider. This is important. Contact a health care provider if:  You keep feeling nauseous or you keep vomiting.  You feel light-headed.  You are still sleepy or having trouble with balance after 24 hours.  You develop a rash.  You have a fever.  You have redness or swelling around the IV site. Get help right away if:  You have trouble breathing.  You have new-onset confusion at home. Summary  For several hours after your procedure, you may feel tired. You may also be forgetful and have poor judgment.  Have a responsible adult stay with you for the time you are told. It is important to have someone help care for you until you are awake and alert.  Rest as told. Do not drive or operate machinery. Do not drink alcohol or take sleeping pills.  Get help right away if you have trouble breathing, or if you suddenly become confused. This information is not intended to replace advice given to you by your health care provider. Make sure you discuss any questions you have with your health care provider. Document Revised: 08/07/2020 Document Reviewed: 10/25/2019 Elsevier Patient Education  2021 [place].   EGD Discharge instructions Please read the instructions outlined below and refer to this sheet in the next few weeks. These discharge instructions provide you with general information on caring for yourself after you leave the hospital. Your doctor may also give you specific instructions. While your treatment has been planned according to the most current medical practices available, unavoidable complications occasionally occur. If you have any problems or  questions after discharge, please call your doctor. ACTIVITY  You may resume your regular activity but move at a slower pace for the next 24 hours.   Take frequent rest periods for the next 24 hours.   Walking will help expel (get rid of) the air and reduce the bloated feeling in your abdomen.   No driving for 24 hours  (because of the anesthesia (medicine) used during the test).   You may shower.   Do not sign any important legal documents or operate any machinery for 24 hours (because of the anesthesia used during the test).  NUTRITION  Drink plenty of fluids.   You may resume your normal diet.   Begin with a light meal and progress to your normal diet.   Avoid alcoholic beverages for 24 hours or as instructed by your caregiver.  MEDICATIONS  You may resume your normal medications unless your caregiver tells you otherwise.  WHAT YOU CAN EXPECT TODAY  You may experience abdominal discomfort such as a feeling of fullness or "gas" pains.  FOLLOW-UP  Your doctor will discuss the results of your test with you.  SEEK IMMEDIATE MEDICAL ATTENTION IF ANY OF THE FOLLOWING OCCUR:  Excessive nausea (feeling sick to your stomach) and/or vomiting.   Severe abdominal pain and distention (swelling).   Trouble swallowing.   Temperature over 101 F (37.8 C).   Rectal bleeding or vomiting of blood.   Stop Protonix  Begin Dexilant 60 mg daily-new prescription provided  Office visit with [place] in 6 weeks  If your symptoms are not improved with the new medication when you return, you may need further evaluation of your gallbladder by doing a function test as discussed  At patient request, I called Hoyle Sauer at [phone] -reviewed findings and recommendations

## 2021-05-11 NOTE — Transfer of Care (Signed)
Immediate Anesthesia Transfer of Care Note  Patient: Donna Ramirez  Procedure(s) Performed: ESOPHAGOGASTRODUODENOSCOPY (EGD) WITH PROPOFOL (N/A )  Patient Location: PACU  Anesthesia Type:General  Level of Consciousness: awake, alert  and oriented  Airway & Oxygen Therapy: Patient Spontanous Breathing  Post-op Assessment: Report given to RN, Post -op Vital signs reviewed and stable and Patient moving all extremities X 4  Post vital signs: Reviewed and stable  Last Vitals:  Vitals Value Taken Time  BP    Temp    Pulse    Resp    SpO2      Last Pain:  Vitals:   05/11/21 1055  TempSrc:   PainSc: 0-No pain      Patients Stated Pain Goal: 5 (42/68/34 1962)  Complications: No complications documented.

## 2021-05-11 NOTE — Anesthesia Preprocedure Evaluation (Signed)
Anesthesia Evaluation  Patient identified by MRN, date of birth, ID band Patient awake    Reviewed: Allergy & Precautions, NPO status , Patient's Chart, lab work & pertinent test results  History of Anesthesia Complications Negative for: history of anesthetic complications  Airway Mallampati: II  TM Distance: >3 FB Neck ROM: Full    Dental  (+) Dental Advisory Given, Teeth Intact   Pulmonary neg pulmonary ROS, Current Smoker and Patient abstained from smoking.,    Pulmonary exam normal breath sounds clear to auscultation       Cardiovascular Exercise Tolerance: Good  Rhythm:Regular Rate:Normal     Neuro/Psych PSYCHIATRIC DISORDERS Anxiety Depression negative neurological ROS     GI/Hepatic negative GI ROS, (+)     substance abuse  marijuana use,   Endo/Other  negative endocrine ROS  Renal/GU negative Renal ROS     Musculoskeletal  (+) Arthritis  (chronic back pain),   Abdominal   Peds  Hematology  (+) Blood dyscrasia (non hodgkin's lymphoma), ,   Anesthesia Other Findings   Reproductive/Obstetrics                            Anesthesia Physical Anesthesia Plan  ASA: II  Anesthesia Plan: General   Post-op Pain Management:    Induction: Intravenous  PONV Risk Score and Plan: Propofol infusion  Airway Management Planned: Nasal Cannula and Natural Airway  Additional Equipment:   Intra-op Plan:   Post-operative Plan:   Informed Consent: I have reviewed the patients History and Physical, chart, labs and discussed the procedure including the risks, benefits and alternatives for the proposed anesthesia with the patient or authorized representative who has indicated his/her understanding and acceptance.     Dental advisory given  Plan Discussed with: CRNA and Surgeon  Anesthesia Plan Comments:         Anesthesia Quick Evaluation

## 2021-05-11 NOTE — Anesthesia Postprocedure Evaluation (Signed)
Anesthesia Post Note  Patient: Donna Ramirez  Procedure(s) Performed: ESOPHAGOGASTRODUODENOSCOPY (EGD) WITH PROPOFOL (N/A )  Patient location during evaluation: Endoscopy Anesthesia Type: General Level of consciousness: awake and alert Pain management: pain level controlled Vital Signs Assessment: post-procedure vital signs reviewed and stable Respiratory status: spontaneous breathing, nonlabored ventilation, respiratory function stable and patient connected to nasal cannula oxygen Cardiovascular status: blood pressure returned to baseline and stable Postop Assessment: no apparent nausea or vomiting Anesthetic complications: no   No complications documented.   Last Vitals:  Vitals:   05/11/21 1006 05/11/21 1112  BP: 115/76 135/60  Pulse: 61 72  Resp: 18 16  Temp: 36.8 C 36.5 C  SpO2: 94% 98%    Last Pain:  Vitals:   05/11/21 1112  TempSrc: Oral  PainSc: 0-No pain                 Talitha Givens

## 2021-05-19 ENCOUNTER — Telehealth: Payer: Self-pay | Admitting: Internal Medicine

## 2021-05-19 ENCOUNTER — Encounter (HOSPITAL_COMMUNITY): Payer: Self-pay | Admitting: Internal Medicine

## 2021-05-19 NOTE — Telephone Encounter (Signed)
Spoke to pt.  Results were reviewed with pt.  Pt hasn't started Dexilant yet.  She plans on going to pick up RX and start it soon.

## 2021-05-19 NOTE — Telephone Encounter (Signed)
Please call patient about her procedure results

## 2021-09-17 ENCOUNTER — Encounter: Payer: Self-pay | Admitting: Internal Medicine

## 2021-10-27 ENCOUNTER — Ambulatory Visit: Payer: Medicare Other | Admitting: Internal Medicine

## 2022-02-03 ENCOUNTER — Encounter: Payer: Self-pay | Admitting: Internal Medicine

## 2022-02-03 ENCOUNTER — Ambulatory Visit: Payer: Medicare Other | Admitting: Gastroenterology

## 2022-04-12 NOTE — Progress Notes (Signed)
? ?GI Office Note   ? ?Referring Provider: Ardine Eng, MD ?Primary Care Physician:  Ardine Eng, MD ?Primary GI: Dr. Gala Romney ? ?Date:  04/13/2022  ?Donna Ramirez, DOB 1971-10-31, MRN [id number] ? ? ?Chief Complaint  ? ?Chief Complaint  ?Patient presents with  ? Nausea  ?  Not eating much.   ? ? ? ?History of Present Illness  ?Donna Ramirez is a 51 y.o. female with a history of anxiety, DDD, depression, fatty liver, HLD, non-Hodgkin's lymphoma diagnosed in 2011 presenting today with complaint of nausea. ? ?Last seen in our office 02/27/2021 -she was having upper abdominal pain primarily in the epigastric area with mild pain in the LUQ and RUQ with everything that she ate.  It was described as an aching pain.  Reported that it occurred with liquids and food.  She was also having occasional nausea without vomiting.  She was taking over-the-counter nausea medication if needed.  She is also having a lack of appetite. Was not having any constipation or diarrhea.  She was having rectal discomfort/burning sensation with improvement with Preparation H.  Wanted to hold off on colonoscopy.  Was also having daily marijuana use.  Labs and RUQ ultrasound ordered, empirically started on PPI.  She had RUQ ultrasound with 3 mm gallbladder polyp, dilated CBD, echogenic liver consistent with steatosis.  She had follow-up MRI/MRCP with no noted biliary ductal dilation, CBD and upper limits of normal measuring 6 mm, no other acute findings. ? ?EGD 05/11/2021 -normal esophagus, patulous EG junction, small hiatal hernia, exam otherwise normal. (Per Dr. Gala Romney symptoms may be secondary to poorly controlled reflux, alternatively biliary dyskinesia has not been ruled out.  Advised to stop Protonix, trial Dexilant 60 mg daily.  If no improvement will consider HIDA with CCK challenge as neck step in evaluation.) ? ?Lab work from November 2022 with normal LFTs and renal function, and normal CBC. ? ?Today: ? ?Nausea ?Patient complains of  nausea and vomiting. Onset of symptoms was about a month ago. Patient describes nausea as moderate.  Symptoms have been associated with mild abdominal pain and lack of appetite . No specific food triggers other than stomach hurts more with spicy foods recently. Patient denies alcohol overuse, fever, hematemesis, and melena. Symptoms have  getting slightly worse and constant . Evaluation to date has been  EGD . Treatment to date has been  tried protonix in the past, not currently on any medication .  ? ?Reports nausea when she eats. Does have a lack of appetite. No unintentional weight loss. Tries to stay away from fatty/greasy foods. Does sometimes have some sour taste in her mouth. Pain is mostly epigastric in nature. Not many carbonated beverages, has cut back on these. Denies dysphagia. Some bloating, and gassiness. Regular daily stools. Has not tried tums or pepcid.  ? ? ?Past Medical History:  ?Diagnosis Date  ? Abdominal wall pain   ? Anxiety   ? Chronic back pain   ? DDD (degenerative disc disease)   ? Depression   ? Duodenitis   ? Fatty liver   ? Fibroid, uterine   ? Hearing loss   ? HLD (hyperlipidemia)   ? Marijuana abuse   ? Non Hodgkin's lymphoma ([place]) 2011  ? ? ?Past Surgical History:  ?Procedure Laterality Date  ? ABDOMINAL HYSTERECTOMY  2008  ? complete  ? BREAST EXCISIONAL BIOPSY Bilateral 2012  ? lymphoma  ? COLONOSCOPY  07/31/10  ? Rourk-hyperplastic polyp  ? COLONOSCOPY  WITH PROPOFOL N/A 04/26/2016  ? Dr. Gala Romney: Grade 3 hemorrhoids.  ? ESOPHAGOGASTRODUODENOSCOPY  08/19/10  ? Rourk-noncritical schatzki ring, abnormal and pillow   ? ESOPHAGOGASTRODUODENOSCOPY  04/2008  ? Rourk-mild erosive reflux esophagitis, noncritical Schatzki's ring status post dilation, 1.5 cm pedunculated mass distal D2  ? ESOPHAGOGASTRODUODENOSCOPY  10/09  ? [facility]-normal  ? ESOPHAGOGASTRODUODENOSCOPY (EGD) WITH PROPOFOL N/A 04/26/2016  ? Dr. Gala Romney: Schatzki ring status post dilation, small hiatal hernia.  ?  ESOPHAGOGASTRODUODENOSCOPY (EGD) WITH PROPOFOL N/A 01/18/2019  ? Surgeon: Daneil Dolin, MD;  normal examined esophagus s/p dilation, small hiatal hernia, otherwise normal exam.  ? ESOPHAGOGASTRODUODENOSCOPY (EGD) WITH PROPOFOL N/A 05/11/2021  ? Procedure: ESOPHAGOGASTRODUODENOSCOPY (EGD) WITH PROPOFOL;  Surgeon: Daneil Dolin, MD;  [facility]: [facility];  Service: Endoscopy;  Laterality: N/A;  10:30am  ? EUS  6/09  ? Jacobs-6-8 MM nodule seen with recent mucosal biopsy 2-3 cm distal to the major papilla, biopsy benign, 1 cm or adenomatous-appearing mucus at the major papilla biopsy (focal mild inflammation associated with slight atypia)  ? LYMPHADENECTOMY  2011  ? MALONEY DILATION N/A 04/26/2016  ? Procedure: MALONEY DILATION;  Surgeon: Daneil Dolin, MD;  Location: [facility];  Service: Endoscopy;  Laterality: N/A;  ? MALONEY DILATION N/A 01/18/2019  ? Procedure: MALONEY DILATION;  Surgeon: Daneil Dolin, MD;  Location: [facility];  Service: Endoscopy;  Laterality: N/A;  ? ? ?Current Outpatient Medications  ?Medication Sig Dispense Refill  ? DULoxetine (CYMBALTA) 60 MG capsule Take 60 mg by mouth daily.    ? fluticasone (FLONASE) 50 MCG/ACT nasal spray Place 1 spray into both nostrils daily.    ? loratadine (CLARITIN) 10 MG tablet Take 10 mg by mouth daily as needed.    ? ?No current facility-administered medications for this visit.  ? ? ?Allergies as of 04/13/2022  ? (No Known Allergies)  ? ? ?Family History  ?Problem Relation Age of Onset  ? Diabetes Mother   ? Breast cancer Mother   ? Hypertension Other   ? Hyperlipidemia Other   ? Cancer Other   ? Colon polyps Father   ?     frequent polyps before age 55  ? Pancreatic cancer Father   ? Colon cancer Paternal Grandmother   ?     also paternal great uncle  ? ? ?Social History  ? ?Socioeconomic History  ? Marital status: Single  ?  Spouse name: Not on file  ? Number of children: 0  ? Years of education: Not on file  ? Highest education level: Not on file   ?Occupational History  ? Occupation: disabled  ?  Employer: UNEMPLOYED  ?Tobacco Use  ? Smoking status: Every Day  ?  Packs/day: 1.00  ?  Years: 25.00  ?  Pack years: 25.00  ?  Types: Cigarettes  ?  Last attempt to quit: 10/04/2017  ?  Years since quitting: 4.5  ? Smokeless tobacco: Never  ?Vaping Use  ? Vaping Use: Never used  ?Substance and Sexual Activity  ? Alcohol use: Not Currently  ?  Alcohol/week: 0.0 standard drinks  ?  Comment: rarely couple times per yr  ? Drug use: Yes  ?  Frequency: 7.0 times per week  ?  Types: Marijuana  ? Sexual activity: Never  ?  Birth control/protection: Surgical  ?Other Topics Concern  ? Not on file  ?Social History Narrative  ? Not on file  ? ?Social Determinants of Health  ? ?Financial Resource Strain: Not  on file  ?Food Insecurity: Not on file  ?Transportation Needs: Not on file  ?Physical Activity: Not on file  ?Stress: Not on file  ?Social Connections: Not on file  ? ? ? ?Review of Systems  ? ?Gen: Denies fever, chills, anorexia. Denies fatigue, weakness, weight loss.  ?CV: Denies chest pain, palpitations, syncope, peripheral edema, and claudication. ?Resp: Denies dyspnea at rest, cough, wheezing, coughing up blood, and pleurisy. ?GI: Denies vomiting blood, jaundice, and fecal incontinence.   Denies dysphagia or odynophagia. ?Derm: Denies rash, itching, dry skin ?Psych: Denies depression, anxiety, memory loss, confusion. No homicidal or suicidal ideation.  ?Heme: Denies bruising, bleeding, and enlarged lymph nodes. ? ? ?Physical Exam  ? ?BP 132/74   Pulse 85   Temp (!) 97.5 ?F (36.4 ?C) (Temporal)   Ht '5\' 5"'$  (1.651 m)   Wt 177 lb 6.4 oz (80.5 kg)   BMI 29.52 kg/m?  ? ?General:   Alert and oriented. No distress noted. Pleasant and cooperative.  ?Head:  Normocephalic and atraumatic. ?Eyes:  Conjuctiva clear without scleral icterus. ?Lungs:  Clear to auscultation bilaterally. No wheezes, rales, or rhonchi. No distress.  ?Heart:  S1, S2 present without murmurs appreciated.   ?Abdomen:  +BS, soft, tenderness to epigastrium, non-distended. No rebound or guarding. No HSM or masses noted. ?Rectal: deferred ?Msk:  Symmetrical without gross deformities. Normal posture. ?Extremities:  Without edema. ?Neuro

## 2022-04-13 ENCOUNTER — Encounter: Payer: Self-pay | Admitting: Gastroenterology

## 2022-04-13 ENCOUNTER — Ambulatory Visit (INDEPENDENT_AMBULATORY_CARE_PROVIDER_SITE_OTHER): Payer: 59 | Admitting: Gastroenterology

## 2022-04-13 VITALS — BP 132/74 | HR 85 | Temp 97.5°F | Ht 65.0 in | Wt 177.4 lb

## 2022-04-13 DIAGNOSIS — R110 Nausea: Secondary | ICD-10-CM

## 2022-04-13 DIAGNOSIS — R1013 Epigastric pain: Secondary | ICD-10-CM | POA: Diagnosis not present

## 2022-04-13 MED ORDER — DEXLANSOPRAZOLE 60 MG PO CPDR: 60.0000 mg | DELAYED_RELEASE_CAPSULE | Freq: Every day | ORAL | 3 refills | Status: DC

## 2022-04-13 NOTE — Patient Instructions (Signed)
I suspect your nausea is primarily related to acid reflux.  I am attaching a handout regarding what acid reflux is, how to treat it, and foods to avoid. ? ?I have sent in [place] 60 mg for you to take daily 30 minutes prior to breakfast.  If the medication is too expensive and you are unable to get it please call the office and I will send in an alternative.  Please let me know in a couple weeks how you are doing and if her nausea is improving. ? ?We will have you follow-up in about 4 months. ? ?It was a pleasure to see you today. I want to create trusting relationships with patients. If you receive a survey regarding your visit,  I greatly appreciate you taking time to fill this out on paper or through your MyChart. I value your feedback. ? ?Venetia Night, MSN, FNP-BC, AGACNP-BC ?[facility] ? ? ?

## 2022-05-10 ENCOUNTER — Other Ambulatory Visit (HOSPITAL_COMMUNITY): Payer: Self-pay | Admitting: Family Medicine

## 2022-05-10 DIAGNOSIS — Z1231 Encounter for screening mammogram for malignant neoplasm of breast: Secondary | ICD-10-CM

## 2022-05-17 NOTE — Progress Notes (Signed)
GI Office Note    Referring Provider: Ardine Eng, MD Primary Care Physician:  Ardine Eng, MD Primary GI: Dr. Gala Romney  Date:  05/20/2022  ID:  Donna Ramirez, DOB [birth year], MRN [id number]   Chief Complaint   Chief Complaint  Patient presents with   Constipation    Constipation sometimes, and other times ok bowel movements, but don't feel like she is completely emptying, cramping and bloating     History of Present Illness  Donna Ramirez is a 51 y.o. female presenting today with a history of anxiety, DDD, depression, faty liver, HLD, and non-hodgkin's lymphoma diagnosed in 2011 presenting today with complaint of reflux, constipation and diarrhea.  Previous work-up included RUQ [place] with 3 mm gallbladder polyp, dilated CBD, echogenic liver consistent with steatosis.  Follow-up MRI/MRCP with no noted biliary ductal dilation, CBD at upper limits of normal measuring 6 mm, no other acute findings.  EGD 05/11/2021 -normal esophagus, patulous GE junction, small hiatal hernia, exam otherwise normal. (Symptoms felt to be likely secondary to poorly controlled reflux, unable to rule out biliary dyskinesia, consider HIDA scan with CCK challenge).  Last colonoscopy May 2017 with entire examined colon normal, grade 3 hemorrhoids present.  Labs November 2022 with normal LFTs, renal function, normal CBC.  Last seen in the office 04/13/2022.  She was having moderate nausea for about a month associated with mild abdominal pain and lack appetite.  She was unable to identify any specific food triggers other than feeling like it may hurt more with spicy foods.  Denies unintentional weight loss.  Also complained of sour taste in her mouth, pain mostly in the epigastric region.  Denied dysphagia.  Also reported bloating and gassiness, having regular daily stools.  She was sent in prescription of Dexilant and GERD diet lifestyle modifications discussed and handout provided.   Today: Epigastric pain -  feels like that is anxiety related.   Dyspepsia/nausea - better, improved. Felt like it could be related to some anxiety.  No longer taking Dexilant, stated she felt like it was not really helping and made her stomach feel weird.  Not having frequent symptoms.  Constipation - More constipated in [place], had a very large bowel movement. Has been since she was on her trip. Was eating regular foods and was doing her normal diet. Did spend a lot of time on the train. Having a hard time going to the bathroom (almost like diarrhea, watery or funny shaped). Stools are sort of jagged. Eats lots of fruits and vegetables and usually very regular. Usually goes once a day and has the urge to go but feels like it wont come out, not having increased frequency of stools. Has tried sitting in a hot tub and that has not helped either. Sometimes feels like she can feel her food moving through her stomach and radiating to her back.  Different feeling than when constipated in the past. Feeling a little bloated as well. Has incomplete emptying, still having the urge to go. Has tried over the counter suppositories that didn't make a big difference. Fiber capsules (metamucil).    Has had an external hemorrhoid for a while that's not very bothersome.    Past Medical History:  Diagnosis Date   Abdominal wall pain    Anxiety    Chronic back pain    DDD (degenerative disc disease)    Depression    Duodenitis    Fatty liver    Fibroid, uterine  Hearing loss    HLD (hyperlipidemia)    Marijuana abuse    Non Hodgkin's lymphoma ([place]) 2011    Past Surgical History:  Procedure Laterality Date   ABDOMINAL HYSTERECTOMY  2008   complete   BREAST EXCISIONAL BIOPSY Bilateral 2012   lymphoma   COLONOSCOPY  07/31/10   [facility]-hyperplastic polyp   COLONOSCOPY WITH PROPOFOL N/A 04/26/2016   Dr. Gala Romney: Grade 3 hemorrhoids.   ESOPHAGOGASTRODUODENOSCOPY  08/19/10   Rourk-noncritical schatzki ring, abnormal and pillow     ESOPHAGOGASTRODUODENOSCOPY  04/2008   Rourk-mild erosive reflux esophagitis, noncritical Schatzki's ring status post dilation, 1.5 cm pedunculated mass distal D2   ESOPHAGOGASTRODUODENOSCOPY  10/09   [facility]-normal   ESOPHAGOGASTRODUODENOSCOPY (EGD) WITH PROPOFOL N/A 04/26/2016   Dr. Gala Romney: Schatzki ring status post dilation, small hiatal hernia.   ESOPHAGOGASTRODUODENOSCOPY (EGD) WITH PROPOFOL N/A 01/18/2019   Surgeon: Daneil Dolin, MD;  normal examined esophagus s/p dilation, small hiatal hernia, otherwise normal exam.   ESOPHAGOGASTRODUODENOSCOPY (EGD) WITH PROPOFOL N/A 05/11/2021   Procedure: ESOPHAGOGASTRODUODENOSCOPY (EGD) WITH PROPOFOL;  Surgeon: Daneil Dolin, MD;  [facility];  Service: Endoscopy;  Laterality: N/A;  10:30am   EUS  6/09   Jacobs-6-8 MM nodule seen with recent mucosal biopsy 2-3 cm distal to the major papilla, biopsy benign, 1 cm or adenomatous-appearing mucus at the major papilla biopsy (focal mild inflammation associated with slight atypia)   LYMPHADENECTOMY  2011   MALONEY DILATION N/A 04/26/2016   Procedure: Venia Minks DILATION;  Surgeon: Daneil Dolin, MD;  [facility];  Service: Endoscopy;  Laterality: N/A;   MALONEY DILATION N/A 01/18/2019   Procedure: Venia Minks DILATION;  Surgeon: Daneil Dolin, MD;  [facility];  Service: Endoscopy;  Laterality: N/A;    Current Outpatient Medications  Medication Sig Dispense Refill   DULoxetine (CYMBALTA) 60 MG capsule Take 60 mg by mouth daily.     fluticasone (FLONASE) 50 MCG/ACT nasal spray Place 1 spray into both nostrils daily.     ibuprofen (ADVIL) 200 MG tablet Take 200 mg by mouth every 6 (six) hours as needed.     loratadine (CLARITIN) 10 MG tablet Take 10 mg by mouth daily as needed.     No current facility-administered medications for this visit.    Allergies as of 05/20/2022   (No Known Allergies)    Family History  Problem Relation Age of Onset   Diabetes Mother    Breast  cancer Mother    Hypertension Other    Hyperlipidemia Other    Cancer Other    Colon polyps Father        frequent polyps before age 35   Pancreatic cancer Father    Colon cancer Paternal Grandmother        also paternal great uncle    Social History   Socioeconomic History   Marital status: Single    Spouse name: Not on file   Number of children: 0   Years of education: Not on file   Highest education level: Not on file  Occupational History   Occupation: disabled    Employer: UNEMPLOYED  Tobacco Use   Smoking status: Every Day    Packs/day: 1.00    Years: 25.00    Total pack years: 25.00    Types: Cigarettes    Last attempt to quit: 10/04/2017    Years since quitting: 4.6   Smokeless tobacco: Never  Vaping Use   Vaping Use: Never used  Substance and Sexual  Activity   Alcohol use: Not Currently    Alcohol/week: 0.0 standard drinks of alcohol    Comment: rarely couple times per yr   Drug use: Yes    Frequency: 7.0 times per week    Types: Marijuana   Sexual activity: Never    Birth control/protection: Surgical  Other Topics Concern   Not on file  Social History Narrative   Not on file   Social Determinants of Health   Financial Resource Strain: Not on file  Food Insecurity: Not on file  Transportation Needs: Not on file  Physical Activity: Not on file  Stress: Not on file  Social Connections: Not on file     Review of Systems   Gen: Denies fever, chills, anorexia. Denies fatigue, weakness, weight loss.  CV: Denies chest pain, palpitations, syncope, peripheral edema, and claudication. Resp: Denies dyspnea at rest, cough, wheezing, coughing up blood, and pleurisy. GI: Denies vomiting blood, jaundice, and fecal incontinence.   Denies dysphagia or odynophagia. Derm: Denies rash, itching, dry skin Psych: Denies depression, anxiety, memory loss, confusion. No homicidal or suicidal ideation.  Heme: Denies bruising, bleeding, and enlarged lymph  nodes.   Physical Exam   BP 136/85   Pulse 91   Temp 97.6 F (36.4 C) (Temporal)   Ht '5\' 5"'$  (1.651 m)   Wt 178 lb (80.7 kg)   BMI 29.62 kg/m   General:   Alert and oriented. No distress noted. Pleasant and cooperative.  Head:  Normocephalic and atraumatic. Eyes:  Conjuctiva clear without scleral icterus. Mouth:  Oral mucosa pink and moist. Good dentition. No lesions. Lungs:  Clear to auscultation bilaterally. No wheezes, rales, or rhonchi. No distress.  Heart:  S1, S2 present without murmurs appreciated.  Abdomen:  +BS, soft, non-distended.  Mild tenderness to left lower abdomen.  Some CVA tenderness noted.  No rebound or guarding. No HSM or masses noted. Rectal: deferred by patient Msk:  Symmetrical without gross deformities. Normal posture. Extremities:  Without edema. Neurologic:  Alert and  oriented x4 Psych:  Alert and cooperative. Normal mood and affect.   Assessment  Donna Ramirez is a 51 y.o. female with a history of anxiety, depression, chronic back pain, DDD, fatty liver, HLD, and non-Hodgkin's lymphoma presenting today for follow-up of reflux, and constipation with likely overflow diarrhea.  Dyspepsia/epigastric pain: Patient felt as though symptoms were primarily related to increased anxiety.  Generally symptoms have been more improved.  No longer taking Dexilant is felt like it was not helpful.  States she follows a fairly healthy diet, drinking water eating lots of fruits and vegetables and not much spicy/greasy foods.  We will continue to monitor  Constipation: Patient reports issues with feeling bloated and issues of incomplete emptying.  Having looser type stools with some associated feelings of urgency.  Patient states that she was constipated while in [place] and had a very large stool that was little difficult to pass.  Since then she has had some of the symptoms.  Thinks it may have also come from sitting in the train for long period of time.  States she has not  made any dietary changes and did not change her diet much while on vacation.  She also notes a feeling like something is stuck in her rectum not allowing stool to pass.  Suspect that her looser type stools are more associated with constipation and some stool burden as this is probably some overflow diarrhea.  Discussed constipation diet and handout provided.  Advised to stop Metamucil fiber pills, begin taking Benefiber daily.  Initially start with MiraLAX 1 capful daily for 3 to 5 days, if loose stools continue after 4 to 5 days then back off on the MiraLAX and see if incomplete emptying sensation continues.  Some of the sensation may be likely due to hemorrhoids as discussed below.  Hemorrhoids: Does admit to frequently having to wipe and some intermittent toilet tissue hematochezia.  Last colonoscopy in 2017 with evidence of grade 3 hemorrhoids.  Patient declined rectal exam today, would like to see if constipation management improved symptoms.  Discussed possible hemorrhoid banding given the symptoms and prior history of grade 3 hemorrhoids.  Suspect that some back pain that you are having may possibly related to your kidneys and your frequency of urination.  May need discussed with PCP.  Recommend cranberry supplementation.  PLAN   Benefiber 2 teaspoons daily, may increase to 2 teaspoons twice daily after 2 weeks. Stop Metamucil fiber pills. Constipation diet Miralax 1 capful daily for 3 to 5 days, then gauge how often to take based on stool consistency. Call with a progress report in a few weeks. Food journal/symptom log advised. Follow-up in 2 months, may be hemorrhoid banding candidate and can discuss further.  She is to call if MiraLAX and fiber does not help improve symptoms.    Venetia Night, MSN, FNP-BC, AGACNP-BC [facility]

## 2022-05-20 ENCOUNTER — Encounter: Payer: Self-pay | Admitting: Gastroenterology

## 2022-05-20 ENCOUNTER — Ambulatory Visit (INDEPENDENT_AMBULATORY_CARE_PROVIDER_SITE_OTHER): Payer: 59 | Admitting: Gastroenterology

## 2022-05-20 VITALS — BP 136/85 | HR 91 | Temp 97.6°F | Ht 65.0 in | Wt 178.0 lb

## 2022-05-20 DIAGNOSIS — R1013 Epigastric pain: Secondary | ICD-10-CM

## 2022-05-20 DIAGNOSIS — K642 Third degree hemorrhoids: Secondary | ICD-10-CM | POA: Diagnosis not present

## 2022-05-20 DIAGNOSIS — G8929 Other chronic pain: Secondary | ICD-10-CM

## 2022-05-20 DIAGNOSIS — K5900 Constipation, unspecified: Secondary | ICD-10-CM | POA: Diagnosis not present

## 2022-05-20 NOTE — Patient Instructions (Signed)
I feel as though your overflow of looser stools as a result of constipation or stool burden that remains in your colon.  I feels that we should focus on better control of this to allow you feeling like you are emptying completely.  As we discussed you may have some looser stools initially with taking 1 capful of MiraLAX daily for 3 to 5 days.  If after the 4 to 5 days you still have looser stools you may back off of the MiraLAX to one half capful daily or 1 capful every other day.  I suspect the sensation that you have regarding feeling something is stuck could be related to worsening of hemorrhoids given your recent bout of constipation after your trip.  If the sensation continues after better control of bowel function/motility that we can discuss having a consultation for hemorrhoid banding.  As we discussed, being sedentary for long length of time can reduce bowel motility.  Increasing exercise, water intake and fiber intake should give you improvement in your constipation.     I will have you follow-up in about 2 months unless your symptoms do not improve and you decide that you would like to proceed with hemorrhoid banding.  I will have appointments available beginning in July and August.

## 2022-05-24 ENCOUNTER — Ambulatory Visit (HOSPITAL_COMMUNITY)
Admission: RE | Admit: 2022-05-24 | Discharge: 2022-05-24 | Disposition: A | Discharge status: Home / Self Care | Payer: 59 | Source: Ambulatory Visit | Attending: Family Medicine | Admitting: Family Medicine

## 2022-05-24 DIAGNOSIS — Z1231 Encounter for screening mammogram for malignant neoplasm of breast: Secondary | ICD-10-CM | POA: Diagnosis not present

## 2022-06-03 ENCOUNTER — Telehealth: Payer: Self-pay | Admitting: Gastroenterology

## 2022-06-03 NOTE — Telephone Encounter (Signed)
Pt states that the constipation is a little better, scheduled on 7/13 @ 11:30 for banding.

## 2022-06-03 NOTE — Telephone Encounter (Signed)
Please call patient for me and see how she is doing with constipation and see if she has decided about hemorrhoid banding and if so have Erline Levine put her down for 7/13.

## 2022-06-15 NOTE — Progress Notes (Unsigned)
   [facility] Banding Procedure Note:   Donna Ramirez is a 51 y.o. female presenting today for consideration of hemorrhoid banding. Last colonoscopy 04/26/2016.  Has a BM everyday usually, spending more than 5 minutes on the toilet due to incomplete emptying. Stools are on the looser end but feels like something is blocking her stools. Maybe has noticed some protruding but has not reduced them herself. Does have some toilet tissue hematochezia that has been going on since about April.   The patient presents with symptomatic grade 2 hemorrhoids, unresponsive to maximal medical therapy, requesting rubber band ligation of his/her hemorrhoidal disease. All risks, benefits, and alternative forms of therapy were described and informed consent was obtained.  In the left lateral decubitus position (if anoscopy is performed) anoscopic examination revealed grade 2 hemorrhoids in the left lateral position (s).  The decision was made to band the left lateral internal hemorrhoid, and the [place] was used to perform band ligation without complication. Digital anorectal examination was then performed to assure proper positioning of the band, and to adjust the banded tissue as required. The patient was discharged home without pain or other issues. Dietary and behavioral recommendations were given and (if necessary prescriptions were given), along with follow-up instructions. The patient will return on 07/13/22 for followup and possible additional banding as required.  No complications were encountered and the patient tolerated the procedure well.    Venetia Night, MSN, FNP-BC, AGACNP-BC [facility]

## 2022-06-17 ENCOUNTER — Ambulatory Visit (INDEPENDENT_AMBULATORY_CARE_PROVIDER_SITE_OTHER): Payer: 59 | Admitting: Gastroenterology

## 2022-06-17 ENCOUNTER — Encounter: Payer: Self-pay | Admitting: Gastroenterology

## 2022-06-17 VITALS — BP 120/82 | HR 86 | Temp 98.9°F | Ht 65.0 in | Wt 180.8 lb

## 2022-06-17 DIAGNOSIS — K641 Second degree hemorrhoids: Secondary | ICD-10-CM

## 2022-06-17 NOTE — Patient Instructions (Signed)
Continue to avoid straining.   Limit toilet time to 2-3 minutes at the most.   Avoid constipation. Take 2 tablespoons of natural wheat bran, natural oat bran, flax, Benefiber or any over the counter fiber supplement and increase your water intake to 7-8 glasses daily.  Occasionally, you may have more bleeding than usual after the banding procedure. This is often from the untreated hemorrhoids rather than the treated one. Don't be concerned if there is a tablespoon or so of blood. If there is more blood than this, lie flat with your bottom higher than your head and apply an ice pack to the area. If the bleeding does not stop within a half an hour or if you feel faint, have severe pain, chills, fever or difficulty passing urine (very rare) or other problems, you should call us at [phone] or report to the nearest emergency room.call our office at [phone] or go to the emergency room.  Please call me with any concerns!  The procedure you have had should have been relatively painless since the banding of the area involved does not have nerve endings and there is no pain sensation. The rubber band cuts off the blood supply to the hemorrhoid and the band may fall off as soon as 48 hours after the banding (the band may occasionally be seen in the toilet bowl following a bowel movement). You may notice a temporary feeling of fullness in the rectum which should respond adequately to plain Tylenol or Motrin.  I will see you back in follow-up and/or for additional banding.   Venetia Night, MSN, FNP-BC, AGACNP-BC [facility]

## 2022-06-22 ENCOUNTER — Telehealth: Payer: Self-pay

## 2022-06-22 NOTE — Telephone Encounter (Signed)
Spoke to pt, she informed me that she is in constant pain from hemorrhoid banding that she had done last week. Pain is on left side of rectum. Wants to know if this is normal?

## 2022-06-22 NOTE — Telephone Encounter (Signed)
Pt phoned and LMOVM regarding being in pain X 1 week more pain than normal. States she has had a few BM's since then. Please call pt

## 2022-06-23 NOTE — Telephone Encounter (Signed)
Noted  

## 2022-07-13 ENCOUNTER — Encounter: Payer: 59 | Admitting: Gastroenterology

## 2022-08-16 ENCOUNTER — Ambulatory Visit: Payer: 59 | Admitting: Gastroenterology

## 2022-10-18 ENCOUNTER — Other Ambulatory Visit (HOSPITAL_COMMUNITY): Payer: Self-pay | Admitting: Family Medicine

## 2022-10-18 DIAGNOSIS — M25511 Pain in right shoulder: Secondary | ICD-10-CM

## 2022-10-18 DIAGNOSIS — M75101 Unspecified rotator cuff tear or rupture of right shoulder, not specified as traumatic: Secondary | ICD-10-CM

## 2022-11-05 ENCOUNTER — Ambulatory Visit (HOSPITAL_COMMUNITY)
Admission: RE | Admit: 2022-11-05 | Discharge: 2022-11-05 | Disposition: A | Discharge status: Home / Self Care | Payer: 59 | Source: Ambulatory Visit | Attending: Family Medicine | Admitting: Family Medicine

## 2022-11-05 DIAGNOSIS — M25511 Pain in right shoulder: Secondary | ICD-10-CM | POA: Insufficient documentation

## 2022-11-05 DIAGNOSIS — M75101 Unspecified rotator cuff tear or rupture of right shoulder, not specified as traumatic: Secondary | ICD-10-CM

## 2022-12-28 ENCOUNTER — Other Ambulatory Visit (HOSPITAL_BASED_OUTPATIENT_CLINIC_OR_DEPARTMENT_OTHER): Payer: Self-pay

## 2022-12-28 DIAGNOSIS — G4733 Obstructive sleep apnea (adult) (pediatric): Secondary | ICD-10-CM

## 2023-05-12 ENCOUNTER — Ambulatory Visit (INDEPENDENT_AMBULATORY_CARE_PROVIDER_SITE_OTHER): Payer: 59 | Admitting: Gastroenterology

## 2023-05-12 ENCOUNTER — Encounter: Payer: Self-pay | Admitting: Gastroenterology

## 2023-05-12 VITALS — BP 129/82 | HR 87 | Temp 97.1°F | Ht 65.0 in | Wt 190.4 lb

## 2023-05-12 DIAGNOSIS — K642 Third degree hemorrhoids: Secondary | ICD-10-CM

## 2023-05-12 NOTE — Patient Instructions (Signed)
  Please avoid straining.  You should limit your toilet time to 2-3 minutes at the most.   I recommend Benefiber 2 teaspoons each morning in the beverage of your choice!  Please call me with any concerns or issues!  I will see you in follow-up for additional banding in several weeks.     It was a pleasure to see you today. I want to create trusting relationships with patients and provide genuine, compassionate, and quality care. I truly value your feedback, so please be on the lookout for a survey regarding your visit with me today. I appreciate your time in completing this!    Kam Kushnir W. Pedram Goodchild, PhD, ANP-BC [facility]     

## 2023-05-12 NOTE — Progress Notes (Signed)
      [facility] BANDING PROCEDURE NOTE  TUYET [name] is a 52 y.o. female presenting today for consideration of hemorrhoid banding. Last colonoscopy May 2017 with entire examined colon normal, grade 3 hemorrhoids present. Left lateral banding in July 2023.    The patient presents with symptomatic grade 3 hemorrhoids, unresponsive to maximal medical therapy, requesting rubber band ligation of her hemorrhoidal disease. All risks, benefits, and alternative forms of therapy were described and informed consent was obtained.  In the left lateral decubitus position, anoscopic examination revealed grade 3 hemorrhoids in the right posterior and left lateral positions predominantly.   The decision was made to band the right posterior internal hemorrhoid, and the [facility] O'Regan System was used to perform band ligation without complication. Digital anorectal examination was then performed to assure proper positioning of the band, and to adjust the banded tissue as required. The patient was discharged home without pain or other issues. Dietary and behavioral recommendations were given, along with follow-up instructions. The patient will return in several weeks for followup and possible additional banding as required.  No complications were encountered and the patient tolerated the procedure well.   Gelene Mink, PhD, ANP-BC [facility]

## 2023-06-02 ENCOUNTER — Ambulatory Visit (INDEPENDENT_AMBULATORY_CARE_PROVIDER_SITE_OTHER): Payer: 59 | Admitting: Gastroenterology

## 2023-06-02 ENCOUNTER — Encounter: Payer: Self-pay | Admitting: Gastroenterology

## 2023-06-02 VITALS — BP 124/82 | HR 83 | Temp 97.3°F | Ht 65.0 in | Wt 191.8 lb

## 2023-06-02 DIAGNOSIS — K642 Third degree hemorrhoids: Secondary | ICD-10-CM

## 2023-06-02 NOTE — Progress Notes (Signed)
    [facility] BANDING PROCEDURE NOTE  Donna Ramirez is a 52 y.o. female presenting today for consideration of hemorrhoid banding. Last colonoscopy May 2017 with entire examined colon normal, grade 3 hemorrhoids present. Left lateral banding in July 2023, right posterior in early June 2024. Anoscopy in June 2024 with grade 3 hemorrhoids in right posterior and left lateral positions predominantly. She has had marked improvement in bleeding, itching, soiling.    The patient presents with symptomatic grade 3 hemorrhoids, unresponsive to maximal medical therapy, requesting rubber band ligation of her hemorrhoidal disease. All risks, benefits, and alternative forms of therapy were described and informed consent was obtained.   The decision was made to band the left lateral internal hemorrhoid, and the [facility] O'Regan System was used to perform band ligation without complication. Digital anorectal examination was then performed to assure proper positioning of the band, and to adjust the banded tissue as required. The patient was discharged home without pain or other issues. Dietary and behavioral recommendations were given, along with follow-up instructions. The patient will return in several weeks for followup and possible additional banding as required. We will do an anoscopy at next visit. If she is doing well, she can certainly postpone this.   No complications were encountered and the patient tolerated the procedure well.   Gelene Mink, PhD, ANP-BC [facility]

## 2023-06-02 NOTE — Patient Instructions (Signed)
  Please avoid straining.  You should limit your toilet time to 2-3 minutes at the most.   I recommend Benefiber 2 teaspoons each morning in the beverage of your choice!  Please call me with any concerns or issues!  I will see you in follow-up in several weeks. If you are doing well, you can postpone the appointment. However, I think it would be a good idea to keep it and I will do the anoscopy to make sure we have gotten all the columns !  I enjoyed seeing you again today! I value our relationship and want to provide genuine, compassionate, and quality care. You may receive a survey regarding your visit with me, and I welcome your feedback! Thanks so much for taking the time to complete this. I look forward to seeing you again.      Gelene Mink, PhD, ANP-BC [facility]

## 2023-07-14 ENCOUNTER — Ambulatory Visit: Payer: 59 | Admitting: Gastroenterology

## 2023-07-14 ENCOUNTER — Encounter: Payer: Self-pay | Admitting: Gastroenterology

## 2023-07-20 ENCOUNTER — Other Ambulatory Visit (HOSPITAL_COMMUNITY): Payer: Self-pay | Admitting: Family Medicine

## 2023-07-20 DIAGNOSIS — Z1231 Encounter for screening mammogram for malignant neoplasm of breast: Secondary | ICD-10-CM

## 2023-07-28 ENCOUNTER — Ambulatory Visit (HOSPITAL_COMMUNITY): Payer: 59

## 2023-09-28 ENCOUNTER — Ambulatory Visit (HOSPITAL_COMMUNITY)
Admission: RE | Admit: 2023-09-28 | Discharge: 2023-09-28 | Disposition: A | Discharge status: Home / Self Care | Payer: 59 | Source: Ambulatory Visit | Attending: Family Medicine | Admitting: Family Medicine

## 2023-09-28 DIAGNOSIS — Z1231 Encounter for screening mammogram for malignant neoplasm of breast: Secondary | ICD-10-CM | POA: Diagnosis present

## 2023-10-04 ENCOUNTER — Other Ambulatory Visit (HOSPITAL_COMMUNITY): Payer: Self-pay | Admitting: Family Medicine

## 2023-10-04 DIAGNOSIS — R928 Other abnormal and inconclusive findings on diagnostic imaging of breast: Secondary | ICD-10-CM

## 2023-10-11 ENCOUNTER — Ambulatory Visit (HOSPITAL_COMMUNITY)
Admission: RE | Admit: 2023-10-11 | Discharge: 2023-10-11 | Disposition: A | Discharge status: Home / Self Care | Payer: 59 | Source: Ambulatory Visit | Attending: Family Medicine | Admitting: Family Medicine

## 2023-10-11 ENCOUNTER — Encounter (HOSPITAL_COMMUNITY): Payer: Self-pay

## 2023-10-11 DIAGNOSIS — R928 Other abnormal and inconclusive findings on diagnostic imaging of breast: Secondary | ICD-10-CM | POA: Diagnosis present

## 2024-11-16 ENCOUNTER — Other Ambulatory Visit (HOSPITAL_COMMUNITY): Payer: Self-pay | Admitting: Family Medicine

## 2024-11-16 DIAGNOSIS — M7989 Other specified soft tissue disorders: Secondary | ICD-10-CM

## 2024-11-19 ENCOUNTER — Ambulatory Visit (HOSPITAL_COMMUNITY)
Admission: RE | Admit: 2024-11-19 | Discharge: 2024-11-19 | Disposition: A | Source: Ambulatory Visit | Attending: Family Medicine | Admitting: Family Medicine

## 2024-11-19 DIAGNOSIS — M7989 Other specified soft tissue disorders: Secondary | ICD-10-CM | POA: Diagnosis present

## 2024-11-21 ENCOUNTER — Other Ambulatory Visit (HOSPITAL_COMMUNITY)

## 2024-11-23 ENCOUNTER — Ambulatory Visit (HOSPITAL_COMMUNITY)
Admission: RE | Admit: 2024-11-23 | Discharge: 2024-11-23 | Disposition: A | Discharge status: Home / Self Care | Source: Ambulatory Visit | Attending: Family Medicine | Admitting: Family Medicine

## 2024-11-23 DIAGNOSIS — M7989 Other specified soft tissue disorders: Secondary | ICD-10-CM | POA: Diagnosis present

## 2024-12-01 ENCOUNTER — Encounter (HOSPITAL_COMMUNITY): Payer: Self-pay

## 2024-12-01 ENCOUNTER — Emergency Department (HOSPITAL_COMMUNITY)

## 2024-12-01 ENCOUNTER — Other Ambulatory Visit: Payer: Self-pay

## 2024-12-01 ENCOUNTER — Emergency Department (HOSPITAL_COMMUNITY)
Admission: EM | Admit: 2024-12-01 | Discharge: 2024-12-01 | Disposition: A | Discharge status: Home / Self Care | Attending: Emergency Medicine | Admitting: Emergency Medicine

## 2024-12-01 DIAGNOSIS — R3121 Asymptomatic microscopic hematuria: Secondary | ICD-10-CM | POA: Diagnosis not present

## 2024-12-01 DIAGNOSIS — I10 Essential (primary) hypertension: Secondary | ICD-10-CM | POA: Diagnosis not present

## 2024-12-01 DIAGNOSIS — R0781 Pleurodynia: Secondary | ICD-10-CM | POA: Diagnosis not present

## 2024-12-01 DIAGNOSIS — R1030 Lower abdominal pain, unspecified: Secondary | ICD-10-CM | POA: Diagnosis present

## 2024-12-01 LAB — COMPREHENSIVE METABOLIC PANEL WITH GFR
ALT: 18 U/L (ref 0–44)
AST: 17 U/L (ref 15–41)
Albumin: 4 g/dL (ref 3.5–5.0)
Alkaline Phosphatase: 100 U/L (ref 38–126)
Anion gap: 7 (ref 5–15)
BUN: 14 mg/dL (ref 6–20)
CO2: 30 mmol/L (ref 22–32)
Calcium: 9.1 mg/dL (ref 8.9–10.3)
Chloride: 105 mmol/L (ref 98–111)
Creatinine, Ser: 0.89 mg/dL (ref 0.44–1.00)
GFR, Estimated: >60 mL/min
Glucose, Bld: 127 mg/dL — ABNORMAL HIGH (ref 70–99)
Potassium: 3.7 mmol/L (ref 3.5–5.1)
Sodium: 141 mmol/L (ref 135–145)
Total Bilirubin: 0.3 mg/dL (ref 0.0–1.2)
Total Protein: 6.5 g/dL (ref 6.5–8.1)

## 2024-12-01 LAB — CBC
HCT: 41.1 % (ref 36.0–46.0)
Hemoglobin: 13.3 g/dL (ref 12.0–15.0)
MCH: 30.2 pg (ref 26.0–34.0)
MCHC: 32.4 g/dL (ref 30.0–36.0)
MCV: 93.4 fL (ref 80.0–100.0)
Platelets: 261 K/uL (ref 150–400)
RBC: 4.4 MIL/uL (ref 3.87–5.11)
RDW: 13.7 % (ref 11.5–15.5)
WBC: 7.6 K/uL (ref 4.0–10.5)
nRBC: 0 % (ref 0.0–0.2)

## 2024-12-01 LAB — URINALYSIS, ROUTINE W REFLEX MICROSCOPIC
Bilirubin Urine: NEGATIVE
Glucose, UA: NEGATIVE mg/dL
Ketones, ur: NEGATIVE mg/dL
Leukocytes,Ua: NEGATIVE
Nitrite: NEGATIVE
Protein, ur: NEGATIVE mg/dL
Specific Gravity, Urine: 1.021 (ref 1.005–1.030)
pH: 6 (ref 5.0–8.0)

## 2024-12-01 LAB — LIPASE, BLOOD: Lipase: 16 U/L (ref 11–51)

## 2024-12-01 LAB — TROPONIN T, HIGH SENSITIVITY: Troponin T High Sensitivity: <15 ng/L (ref 0–19)

## 2024-12-01 MED ORDER — ONDANSETRON HCL 4 MG/2ML IJ SOLN: 4.0000 mg | Freq: Once | INTRAMUSCULAR | Status: DC | PRN

## 2024-12-01 MED ORDER — IOHEXOL 300 MG/ML  SOLN
100.0000 mL | Freq: Once | INTRAMUSCULAR | Status: AC | PRN
  Administered 2024-12-01: 100 mL via INTRAVENOUS

## 2024-12-01 NOTE — ED Notes (Signed)
 Patient transported to CT

## 2024-12-01 NOTE — Discharge Instructions (Signed)
 You came only emergency department for your abdominal pain.  Your workup showed no signs of infection or kidney stones and it is unclear what is causing your pain at this time though you did have some microscopic blood in your urine.  You can follow-up with your primary doctor to have your symptoms rechecked and follow-up with urology for further investigation of the blood in your urine.  You should return to the emergency department if you are having significantly worsening pain, fevers, repetitive vomiting or any other new or concerning symptoms.

## 2024-12-01 NOTE — ED Notes (Signed)
 Pt c/o pain that moves around lower abdomen and back and intermittent L lower rib cage pain.  Denies n/v/d.  Pt reports pain in different areas is intermittent, but she always has pain.

## 2024-12-01 NOTE — ED Triage Notes (Signed)
 Pt arrived POV with c/o chest pain x 1 month and last episode was yesterday, no n/v with episode.  Left rib pain that comes and goes. Abd pain that is all over that radiates to all around her back x 1 week. No n/v noted . Pt also c/o burning when she urinates x months.

## 2024-12-01 NOTE — ED Provider Notes (Signed)
 " [facility] AT [facility] Provider Note   CSN: [id number] Arrival date & time: 12/01/24  9256     Patient presents with: Abdominal Pain and Chest Pain   Donna Ramirez is a 53 y.o. female.   53 year old female with past medical history of hypertension, hyperlipidemia, fibromyalgia, anxiety/depression presenting to the emergency department with abdominal pain.  Patient states for the last week she has had pain in her lower abdomen.  She states that the pain is constant but will often moves locations or sometimes she will sit on that right sometimes the left, sometimes the middle and sometimes in her back.  She states that she feels like there is a band around her abdomen.  She states that last night she felt some pain in her left lower rib cage with some associated shortness of breath but that is since resolved.  She denies any fever, cough, nausea, vomiting.  She reports chronic constipation.  She states that she has had intermittent dysuria but is not currently having any dysuria, hematuria or urinary frequency or urgency.  The history is provided by the patient.  Abdominal Pain Associated symptoms: chest pain   Chest Pain Associated symptoms: abdominal pain        Prior to Admission medications  Medication Sig Start Date End Date Taking? Authorizing Provider  buPROPion (WELLBUTRIN XL) 150 MG 24 hr tablet Take 150 mg by mouth daily. 02/16/23   [provider]  DULoxetine (CYMBALTA) 60 MG capsule Take 60 mg by mouth daily. 02/24/21   [provider]  fluticasone (FLONASE) 50 MCG/ACT nasal spray Place 1 spray into both nostrils daily. 02/20/22   [provider]  ibuprofen  (ADVIL ) 200 MG tablet Take 200 mg by mouth every 6 (six) hours as needed.    [provider]  rosuvastatin (CRESTOR) 20 MG tablet Take 20 mg by mouth daily. 02/13/23   [provider]    Allergies: Patient has no known allergies.    Review of  Systems  Cardiovascular:  Positive for chest pain.  Gastrointestinal:  Positive for abdominal pain.    Updated Vital Signs BP (!) 114/99   Pulse 83   Temp 97.9 F (36.6 C) (Oral)   Resp 20   Ht 5' 5 (1.651 m)   Wt 93 kg   SpO2 96%   BMI 34.11 kg/m   Physical Exam Vitals and nursing note reviewed.  Constitutional:      General: She is not in acute distress.    Appearance: She is well-developed.  HENT:     Head: Normocephalic and atraumatic.     Mouth/Throat:     Mouth: Mucous membranes are moist.  Eyes:     Extraocular Movements: Extraocular movements intact.  Cardiovascular:     Rate and Rhythm: Normal rate and regular rhythm.     Heart sounds: Normal heart sounds.  Pulmonary:     Effort: Pulmonary effort is normal.     Breath sounds: Normal breath sounds.  Abdominal:     General: Abdomen is flat.     Palpations: Abdomen is soft.     Tenderness: There is abdominal tenderness (across lower abdomen). There is no right CVA tenderness, left CVA tenderness, guarding or rebound.  Skin:    General: Skin is warm and dry.  Neurological:     General: No focal deficit present.     Mental Status: She is alert and oriented to person, place, and time.  Psychiatric:  Mood and Affect: Mood normal.        Behavior: Behavior normal.     (all labs ordered are listed, but only abnormal results are displayed) Labs Reviewed  COMPREHENSIVE METABOLIC PANEL WITH GFR - Abnormal; Notable for the following components:      Result Value   Glucose, Bld 127 (*)    All other components within normal limits  URINALYSIS, ROUTINE W REFLEX MICROSCOPIC - Abnormal; Notable for the following components:   Hgb urine dipstick MODERATE (*)    Bacteria, UA RARE (*)    All other components within normal limits  LIPASE, BLOOD  CBC  TROPONIN T, HIGH SENSITIVITY    EKG: EKG Interpretation Date/Time:  Saturday December 01 2024 08:03:24 EST Ventricular Rate:  90 PR Interval:  134 QRS  Duration:  70 QT Interval:  361 QTC Calculation: 442 R Axis:   158  Text Interpretation: Right and left arm electrode reversal, interpretation assumes no reversal Sinus rhythm Probable lateral infarct, age indeterminate No significant change since last tracing Confirmed by Ellouise Fine ([id number]) on 12/01/2024 8:06:11 AM  Radiology: CT ABDOMEN PELVIS W CONTRAST Result Date: 12/01/2024 CLINICAL DATA:  Abdominal pain. EXAM: CT ABDOMEN AND PELVIS WITH CONTRAST TECHNIQUE: Multidetector CT imaging of the abdomen and pelvis was performed using the standard protocol following bolus administration of intravenous contrast. RADIATION DOSE REDUCTION: This exam was performed according to the departmental dose-optimization program which includes automated exposure control, adjustment of the mA and/or kV according to patient size and/or use of iterative reconstruction technique. CONTRAST:  OMNIPAQUE  IOHEXOL  300 MG/ML  SOLN COMPARISON:  04/04/2008 FINDINGS: Lower chest: No acute findings. Hepatobiliary: No suspicious focal abnormality within the liver parenchyma. There is no evidence for gallstones, gallbladder wall thickening, or pericholecystic fluid. No intrahepatic or extrahepatic biliary dilation. Pancreas: No focal mass lesion. No dilatation of the main duct. No intraparenchymal cyst. No peripancreatic edema. Spleen: No splenomegaly. No suspicious focal mass lesion. Adrenals/Urinary Tract: No adrenal nodule or mass. Kidneys unremarkable. No evidence for hydroureter. The urinary bladder appears normal for the degree of distention. Stomach/Bowel: Stomach is unremarkable. No gastric wall thickening. No evidence of outlet obstruction. Duodenum is normally positioned as is the ligament of Treitz. No small bowel wall thickening. No small bowel dilatation. The terminal ileum is normal. The appendix is normal. No gross colonic mass. No colonic wall thickening. Vascular/Lymphatic: No abdominal aortic aneurysm. No  abdominal aortic atherosclerotic calcification. There is no gastrohepatic or hepatoduodenal ligament lymphadenopathy. No retroperitoneal or mesenteric lymphadenopathy. No pelvic sidewall lymphadenopathy. Reproductive: Hysterectomy.  There is no adnexal mass. Other: No substantial intraperitoneal free fluid. Musculoskeletal: No worrisome lytic or sclerotic osseous abnormality. Degenerative changes noted L5-S1. IMPRESSION: No acute findings in the abdomen or pelvis. Specifically, no findings to explain the patient's history of abdominal pain. Electronically Signed   By: Camellia Candle M.D.   On: 12/01/2024 09:42   DG Chest 2 View Result Date: 12/01/2024 CLINICAL DATA:  Chest pain EXAM: CHEST - 2 VIEW COMPARISON:  03/11/2016 FINDINGS: The lungs are clear without focal pneumonia, edema, pneumothorax or pleural effusion. The cardiopericardial silhouette is within normal limits for size. No acute bony abnormality. Telemetry leads overlie the chest. IMPRESSION: No active cardiopulmonary disease. Electronically Signed   By: Camellia Candle M.D.   On: 12/01/2024 09:39     Procedures   Medications Ordered in the ED  ondansetron  (ZOFRAN ) injection 4 mg (has no administration in time range)  iohexol  (OMNIPAQUE ) 300 MG/ML solution 100 mL (100 mLs  Intravenous Contrast Given 12/01/24 0916)    Clinical Course as of 12/01/24 1006  Sat Dec 01, 2024  0904 Labs within normal range. Chest pain started last night so single troponin is sufficient. UA and imaging pending.  [VK]  0947 UA with microscopic hematuria, CTAP without acute disease - no kidney stones, CXR without acute disease. Unclear etiology of symptoms but patient is stable for discharge home. Recommended urology follow up. [VK]    Clinical Course User Index [VK] Kingsley, Margurette Brener K, DO                                 Medical Decision Making This patient presents to the ED with chief complaint(s) of abdominal pain with pertinent past medical history of  HTN, HLD, fibromyalgia, anxiety/depression which further complicates the presenting complaint. The complaint involves an extensive differential diagnosis and also carries with it a high risk of complications and morbidity.    The differential diagnosis includes UTI, nephrolithiasis, pyelonephritis, gastroenteritis, constipation, ACS, arrhythmia, anemia, pneumonia, pneumothorax, pulmonary edema, pleural effusion, gastritis, GERD  Additional history obtained: Additional history obtained from N/A Records reviewed Care Everywhere/External Records  ED Course and Reassessment: On patient's arrival she is hemodynamically stable in no acute distress.  EKG on arrival showed normal sinus rhythm without acute ischemic changes.  The patient will have labs including troponin and urine as well as CT abdomen pelvis to evaluate for etiology of her symptoms.  She declines any pain control at this time and will be closely reassessed.  Independent labs interpretation:  The following labs were independently interpreted: hematuria otherwise within normal range  Independent visualization of imaging: - I independently visualized the following imaging with scope of interpretation limited to determining acute life threatening conditions related to emergency care: CTAP, CXR, which revealed no acute disease  Consultation: - Consulted or discussed management/test interpretation w/ external professional: N/A  Consideration for admission or further workup: Patient has no emergent conditions requiring admission or further work-up at this time and is stable for discharge home with primary care and urology follow-up  Social Determinants of health: N/A    Amount and/or Complexity of Data Reviewed Labs: ordered. Radiology: ordered.  Risk Prescription drug management.       Final diagnoses:  Lower abdominal pain  Asymptomatic microscopic hematuria    ED Discharge Orders     None          Ellouise Richerd POUR, DO 12/01/24 1006  "

## 2024-12-01 NOTE — ED Notes (Signed)
 Pt verbalized understanding of discharge instructions. Opportunity for questions provided.

## 2024-12-13 ENCOUNTER — Other Ambulatory Visit (HOSPITAL_COMMUNITY): Payer: Self-pay | Admitting: Family Medicine

## 2024-12-13 DIAGNOSIS — Z1231 Encounter for screening mammogram for malignant neoplasm of breast: Secondary | ICD-10-CM

## 2024-12-21 ENCOUNTER — Ambulatory Visit (HOSPITAL_COMMUNITY)
Admission: RE | Admit: 2024-12-21 | Discharge: 2024-12-21 | Disposition: A | Discharge status: Home / Self Care | Source: Ambulatory Visit | Attending: Family Medicine | Admitting: Family Medicine

## 2024-12-21 DIAGNOSIS — Z1231 Encounter for screening mammogram for malignant neoplasm of breast: Secondary | ICD-10-CM | POA: Diagnosis present
